# Patient Record
Sex: Female | Born: 1965 | Race: White | Hispanic: No | Marital: Married | Smoking: Former smoker
Health system: Southern US, Community
[De-identification: ages and names within clinical notes are randomized; demographics above are authoritative.]

## PROBLEM LIST (undated history)

## (undated) DIAGNOSIS — F329 Major depressive disorder, single episode, unspecified: Secondary | ICD-10-CM

## (undated) DIAGNOSIS — E039 Hypothyroidism, unspecified: Secondary | ICD-10-CM

## (undated) DIAGNOSIS — F32A Depression, unspecified: Secondary | ICD-10-CM

## (undated) DIAGNOSIS — F1010 Alcohol abuse, uncomplicated: Secondary | ICD-10-CM

## (undated) DIAGNOSIS — IMO0002 Reserved for concepts with insufficient information to code with codable children: Secondary | ICD-10-CM

## (undated) DIAGNOSIS — N159 Renal tubulo-interstitial disease, unspecified: Secondary | ICD-10-CM

## (undated) DIAGNOSIS — T4145XA Adverse effect of unspecified anesthetic, initial encounter: Secondary | ICD-10-CM

## (undated) DIAGNOSIS — K219 Gastro-esophageal reflux disease without esophagitis: Secondary | ICD-10-CM

## (undated) DIAGNOSIS — M810 Age-related osteoporosis without current pathological fracture: Secondary | ICD-10-CM

## (undated) DIAGNOSIS — M545 Low back pain: Secondary | ICD-10-CM

## (undated) DIAGNOSIS — R011 Cardiac murmur, unspecified: Secondary | ICD-10-CM

## (undated) DIAGNOSIS — F941 Reactive attachment disorder of childhood: Secondary | ICD-10-CM

## (undated) DIAGNOSIS — F419 Anxiety disorder, unspecified: Secondary | ICD-10-CM

## (undated) DIAGNOSIS — M1990 Unspecified osteoarthritis, unspecified site: Secondary | ICD-10-CM

## (undated) HISTORY — PX: AUGMENTATION MAMMAPLASTY: SUR837

## (undated) HISTORY — DX: Low back pain: M54.5

## (undated) HISTORY — DX: Hypothyroidism, unspecified: E03.9

## (undated) HISTORY — DX: Reserved for concepts with insufficient information to code with codable children: IMO0002

---

## 1995-03-17 DIAGNOSIS — T8859XA Other complications of anesthesia, initial encounter: Secondary | ICD-10-CM

## 1995-03-17 DIAGNOSIS — N159 Renal tubulo-interstitial disease, unspecified: Secondary | ICD-10-CM

## 1995-03-17 HISTORY — DX: Other complications of anesthesia, initial encounter: T88.59XA

## 1995-03-17 HISTORY — DX: Renal tubulo-interstitial disease, unspecified: N15.9

## 1997-05-04 ENCOUNTER — Observation Stay (HOSPITAL_COMMUNITY): Admission: RE | Admit: 1997-05-04 | Discharge: 1997-05-05 | Payer: Self-pay | Admitting: Obstetrics and Gynecology

## 1997-06-28 ENCOUNTER — Other Ambulatory Visit: Admission: RE | Admit: 1997-06-28 | Discharge: 1997-06-28 | Payer: Self-pay | Admitting: Obstetrics and Gynecology

## 1997-08-14 ENCOUNTER — Other Ambulatory Visit: Admission: RE | Admit: 1997-08-14 | Discharge: 1997-08-14 | Payer: Self-pay | Admitting: Obstetrics and Gynecology

## 1997-10-26 ENCOUNTER — Emergency Department (HOSPITAL_COMMUNITY): Admission: EM | Admit: 1997-10-26 | Discharge: 1997-10-26 | Payer: Self-pay | Admitting: Emergency Medicine

## 1997-11-08 ENCOUNTER — Ambulatory Visit (HOSPITAL_COMMUNITY): Admission: RE | Admit: 1997-11-08 | Discharge: 1997-11-08 | Payer: Self-pay | Admitting: Obstetrics and Gynecology

## 1998-01-28 ENCOUNTER — Other Ambulatory Visit: Admission: RE | Admit: 1998-01-28 | Discharge: 1998-01-28 | Payer: Self-pay | Admitting: Obstetrics and Gynecology

## 1998-07-02 ENCOUNTER — Inpatient Hospital Stay (HOSPITAL_COMMUNITY): Admission: AD | Admit: 1998-07-02 | Discharge: 1998-07-27 | Payer: Self-pay | Admitting: Obstetrics and Gynecology

## 1998-07-09 ENCOUNTER — Encounter: Payer: Self-pay | Admitting: Obstetrics and Gynecology

## 1998-07-15 ENCOUNTER — Encounter: Payer: Self-pay | Admitting: Obstetrics and Gynecology

## 1998-07-18 ENCOUNTER — Encounter: Payer: Self-pay | Admitting: Obstetrics and Gynecology

## 1998-07-22 ENCOUNTER — Encounter: Payer: Self-pay | Admitting: Obstetrics and Gynecology

## 1998-08-16 ENCOUNTER — Inpatient Hospital Stay (HOSPITAL_COMMUNITY): Admission: AD | Admit: 1998-08-16 | Discharge: 1998-08-20 | Payer: Self-pay | Admitting: Obstetrics and Gynecology

## 1998-09-25 ENCOUNTER — Other Ambulatory Visit: Admission: RE | Admit: 1998-09-25 | Discharge: 1998-09-25 | Payer: Self-pay | Admitting: Obstetrics and Gynecology

## 1998-11-05 ENCOUNTER — Ambulatory Visit (HOSPITAL_COMMUNITY): Admission: RE | Admit: 1998-11-05 | Discharge: 1998-11-05 | Payer: Self-pay | Admitting: Orthopaedic Surgery

## 1998-11-19 ENCOUNTER — Ambulatory Visit (HOSPITAL_COMMUNITY): Admission: RE | Admit: 1998-11-19 | Discharge: 1998-11-19 | Payer: Self-pay | Admitting: Orthopaedic Surgery

## 1998-12-02 ENCOUNTER — Ambulatory Visit (HOSPITAL_COMMUNITY): Admission: RE | Admit: 1998-12-02 | Discharge: 1998-12-02 | Payer: Self-pay | Admitting: Orthopaedic Surgery

## 2000-04-14 ENCOUNTER — Encounter: Payer: Self-pay | Admitting: Internal Medicine

## 2000-04-14 ENCOUNTER — Encounter: Admission: RE | Admit: 2000-04-14 | Discharge: 2000-04-14 | Payer: Self-pay | Admitting: Internal Medicine

## 2000-12-21 ENCOUNTER — Other Ambulatory Visit: Admission: RE | Admit: 2000-12-21 | Discharge: 2000-12-21 | Payer: Self-pay | Admitting: Obstetrics and Gynecology

## 2002-02-13 ENCOUNTER — Other Ambulatory Visit: Admission: RE | Admit: 2002-02-13 | Discharge: 2002-02-13 | Payer: Self-pay | Admitting: Obstetrics and Gynecology

## 2003-06-17 ENCOUNTER — Emergency Department (HOSPITAL_COMMUNITY): Admission: EM | Admit: 2003-06-17 | Discharge: 2003-06-17 | Payer: Self-pay | Admitting: Emergency Medicine

## 2003-07-16 ENCOUNTER — Ambulatory Visit (HOSPITAL_COMMUNITY): Admission: RE | Admit: 2003-07-16 | Discharge: 2003-07-16 | Payer: Self-pay | Admitting: Obstetrics and Gynecology

## 2005-03-16 HISTORY — PX: TUBAL LIGATION: SHX77

## 2005-07-15 ENCOUNTER — Encounter: Payer: Self-pay | Admitting: Obstetrics and Gynecology

## 2005-08-02 ENCOUNTER — Inpatient Hospital Stay (HOSPITAL_COMMUNITY): Admission: AD | Admit: 2005-08-02 | Discharge: 2005-08-02 | Payer: Self-pay | Admitting: Obstetrics and Gynecology

## 2005-08-27 ENCOUNTER — Inpatient Hospital Stay (HOSPITAL_COMMUNITY): Admission: AD | Admit: 2005-08-27 | Discharge: 2005-08-27 | Payer: Self-pay | Admitting: *Deleted

## 2005-09-02 ENCOUNTER — Inpatient Hospital Stay (HOSPITAL_COMMUNITY): Admission: AD | Admit: 2005-09-02 | Discharge: 2005-09-05 | Payer: Self-pay | Admitting: Obstetrics and Gynecology

## 2005-09-02 ENCOUNTER — Encounter (INDEPENDENT_AMBULATORY_CARE_PROVIDER_SITE_OTHER): Payer: Self-pay | Admitting: *Deleted

## 2006-06-16 ENCOUNTER — Ambulatory Visit: Payer: Self-pay | Admitting: Endocrinology

## 2006-07-23 ENCOUNTER — Ambulatory Visit: Payer: Self-pay | Admitting: Internal Medicine

## 2006-08-04 ENCOUNTER — Encounter: Admission: RE | Admit: 2006-08-04 | Discharge: 2006-08-04 | Payer: Self-pay | Admitting: Obstetrics and Gynecology

## 2006-10-20 ENCOUNTER — Ambulatory Visit: Payer: Self-pay | Admitting: Endocrinology

## 2006-12-01 ENCOUNTER — Encounter: Admission: RE | Admit: 2006-12-01 | Discharge: 2006-12-01 | Payer: Self-pay | Admitting: Obstetrics and Gynecology

## 2006-12-13 ENCOUNTER — Encounter: Payer: Self-pay | Admitting: *Deleted

## 2006-12-13 DIAGNOSIS — M545 Low back pain, unspecified: Secondary | ICD-10-CM

## 2006-12-13 HISTORY — DX: Low back pain, unspecified: M54.50

## 2006-12-16 ENCOUNTER — Ambulatory Visit: Payer: Self-pay | Admitting: Endocrinology

## 2006-12-16 ENCOUNTER — Ambulatory Visit: Payer: Self-pay | Admitting: Cardiology

## 2006-12-16 LAB — CONVERTED CEMR LAB
Basophils Absolute: 0 10*3/uL (ref 0.0–0.1)
Eosinophils Absolute: 0.1 10*3/uL (ref 0.0–0.6)
Eosinophils Relative: 1.1 % (ref 0.0–5.0)
Lymphocytes Relative: 45.3 % (ref 12.0–46.0)
MCHC: 33.6 g/dL (ref 30.0–36.0)
MCV: 83.7 fL (ref 78.0–100.0)
Neutro Abs: 3.2 10*3/uL (ref 1.4–7.7)
Platelets: 240 10*3/uL (ref 150–400)
RBC: 4.63 M/uL (ref 3.87–5.11)

## 2007-03-28 ENCOUNTER — Telehealth (INDEPENDENT_AMBULATORY_CARE_PROVIDER_SITE_OTHER): Payer: Self-pay | Admitting: *Deleted

## 2007-04-20 ENCOUNTER — Telehealth: Payer: Self-pay | Admitting: Endocrinology

## 2008-02-02 ENCOUNTER — Ambulatory Visit (HOSPITAL_COMMUNITY): Admission: RE | Admit: 2008-02-02 | Discharge: 2008-02-02 | Payer: Self-pay | Admitting: Obstetrics and Gynecology

## 2008-04-09 ENCOUNTER — Ambulatory Visit: Payer: Self-pay | Admitting: Endocrinology

## 2008-04-09 DIAGNOSIS — E039 Hypothyroidism, unspecified: Secondary | ICD-10-CM

## 2008-04-09 HISTORY — DX: Hypothyroidism, unspecified: E03.9

## 2008-04-09 LAB — CONVERTED CEMR LAB: TSH: 8.75 microintl units/mL — ABNORMAL HIGH (ref 0.35–5.50)

## 2008-04-17 ENCOUNTER — Ambulatory Visit: Payer: Self-pay | Admitting: Internal Medicine

## 2008-04-17 ENCOUNTER — Encounter: Payer: Self-pay | Admitting: Endocrinology

## 2008-05-23 ENCOUNTER — Ambulatory Visit: Payer: Self-pay | Admitting: Endocrinology

## 2008-05-25 LAB — CONVERTED CEMR LAB: TSH: 0.56 microintl units/mL (ref 0.35–5.50)

## 2008-08-22 ENCOUNTER — Ambulatory Visit: Payer: Self-pay | Admitting: Vascular Surgery

## 2008-09-19 ENCOUNTER — Ambulatory Visit: Payer: Self-pay | Admitting: Vascular Surgery

## 2009-02-15 ENCOUNTER — Ambulatory Visit (HOSPITAL_COMMUNITY): Admission: RE | Admit: 2009-02-15 | Discharge: 2009-02-15 | Payer: Self-pay | Admitting: Obstetrics and Gynecology

## 2009-02-20 ENCOUNTER — Encounter: Admission: RE | Admit: 2009-02-20 | Discharge: 2009-02-20 | Payer: Self-pay | Admitting: Obstetrics and Gynecology

## 2009-06-13 ENCOUNTER — Ambulatory Visit: Payer: Self-pay | Admitting: Endocrinology

## 2009-06-13 DIAGNOSIS — R599 Enlarged lymph nodes, unspecified: Secondary | ICD-10-CM | POA: Insufficient documentation

## 2009-06-13 LAB — CONVERTED CEMR LAB
ALT: 26 units/L (ref 0–35)
AST: 25 units/L (ref 0–37)
Albumin: 4.3 g/dL (ref 3.5–5.2)
Alkaline Phosphatase: 41 units/L (ref 39–117)
Basophils Relative: 0.7 % (ref 0.0–3.0)
Calcium: 9.2 mg/dL (ref 8.4–10.5)
Direct LDL: 96.9 mg/dL
Eosinophils Relative: 0.4 % (ref 0.0–5.0)
GFR calc non Af Amer: 83.3 mL/min (ref >60)
Glucose, Bld: 114 mg/dL — ABNORMAL HIGH (ref 70–99)
HCT: 38.4 % (ref 36.0–46.0)
HDL: 103.5 mg/dL (ref >39.00)
Hemoglobin: 12.7 g/dL (ref 12.0–15.0)
Lymphocytes Relative: 28.6 % (ref 12.0–46.0)
Lymphs Abs: 2 10*3/uL (ref 0.7–4.0)
Monocytes Relative: 8.7 % (ref 3.0–12.0)
Neutro Abs: 4.5 10*3/uL (ref 1.4–7.7)
Potassium: 4.3 meq/L (ref 3.5–5.1)
RBC: 4.34 M/uL (ref 3.87–5.11)
Sodium: 138 meq/L (ref 135–145)
Total CHOL/HDL Ratio: 2
Total Protein: 7.8 g/dL (ref 6.0–8.3)
Triglycerides: 45 mg/dL (ref 0.0–149.0)

## 2009-07-10 ENCOUNTER — Emergency Department (HOSPITAL_BASED_OUTPATIENT_CLINIC_OR_DEPARTMENT_OTHER): Admission: EM | Admit: 2009-07-10 | Discharge: 2009-07-10 | Payer: Self-pay | Admitting: Emergency Medicine

## 2009-09-19 ENCOUNTER — Telehealth: Payer: Self-pay | Admitting: Endocrinology

## 2009-09-19 ENCOUNTER — Encounter: Payer: Self-pay | Admitting: Endocrinology

## 2009-09-20 ENCOUNTER — Encounter: Payer: Self-pay | Admitting: Endocrinology

## 2009-09-20 ENCOUNTER — Telehealth: Payer: Self-pay | Admitting: Endocrinology

## 2009-09-22 ENCOUNTER — Encounter: Payer: Self-pay | Admitting: Endocrinology

## 2009-09-22 DIAGNOSIS — L708 Other acne: Secondary | ICD-10-CM

## 2010-02-11 ENCOUNTER — Telehealth: Payer: Self-pay | Admitting: Endocrinology

## 2010-04-05 ENCOUNTER — Other Ambulatory Visit: Payer: Self-pay | Admitting: Obstetrics and Gynecology

## 2010-04-05 DIAGNOSIS — Z1231 Encounter for screening mammogram for malignant neoplasm of breast: Secondary | ICD-10-CM

## 2010-04-17 NOTE — Progress Notes (Signed)
Summary: Rx req  Phone Note Call from Patient Call back at [facility] Phone [phone] Call back at Work Phone [phone]   Caller: Patient Summary of Call: Pt called requesting MD call in Rx for generic Retin-A. Pt states that she has been Rxd this by SAE before for adult acne. Pt was advised that this is not on med list but she still requests that I speak with MD. Please advise. Initial call taken by: Margaret Pyle, CMA,  September 19, 2009 2:27 PM  Follow-up for Phone Call        sent Follow-up by: Minus Breeding MD,  September 19, 2009 2:33 PM  Additional Follow-up for Phone Call Additional follow up Details #1::        pt is also requesting Minocycline (per pt she sometimes also gets cyst with acne) Additional Follow-up by: Margaret Pyle, CMA,  September 19, 2009 2:43 PM    Additional Follow-up for Phone Call Additional follow up Details #2::    this is on med list.  i refilled as needed. Follow-up by: Minus Breeding MD,  September 19, 2009 3:55 PM  Additional Follow-up for Phone Call Additional follow up Details #3:: Details for Additional Follow-up Action Taken: Pt informed Additional Follow-up by: Margaret Pyle, CMA,  September 19, 2009 4:07 PM  New/Updated Medications: RETIN-A 0.025 % CREA (TRETINOIN) apply at bedtime Prescriptions: DOXYCYCLINE HYCLATE 100 MG TABS (DOXYCYCLINE HYCLATE) 1 tab two times a day  #20 x 5   Entered and Authorized by:   Minus Breeding MD   Signed by:   Minus Breeding MD on 09/19/2009   Method used:   Electronically to        [facility] (retail)       [address]       [place], [place]  [address]       Ph: [phone] or [phone]       Fax: [phone]   RxID:   [id number] RETIN-A 0.025 % CREA (TRETINOIN) apply at bedtime  #1 med tube x 11   Entered by:   Margaret Pyle, CMA   Authorized by:   Minus Breeding MD   Signed by:   Margaret Pyle, CMA on 09/19/2009   Method  used:   Electronically to        [facility] (retail)       [address]       [place], [place]  [address]       Ph: [phone] or [phone]       Fax: [phone]   RxID:   [id number] RETIN-A 0.025 % CREA (TRETINOIN) apply at bedtime  #1 med tube x 11   Entered and Authorized by:   Minus Breeding MD   Signed by:   Minus Breeding MD on 09/19/2009   Method used:   Electronically to        [facility] [phone]* (retail)       [address]       [place], [place]  [address]       Ph: [phone]       Fax: [phone]   RxID:   [id number]

## 2010-04-17 NOTE — Progress Notes (Signed)
Summary: Results-Health Screenings  Phone Note Call from Patient Call back at Home Phone [phone] Call back at Work Phone [phone]   Caller: Patient Summary of Call: Regarding recent results of health screening sent for MD to review, pt wants to know if there is anything she needs to do now regarding her results. Pt has scheduled appointment for CPX for March 2012. Initial call taken by: Brenton Grills CMA Duncan Dull),  February 11, 2010 4:04 PM  Follow-up for Phone Call        no need.  we can address then.   Follow-up by: Minus Breeding MD,  February 11, 2010 4:09 PM  Additional Follow-up for Phone Call Additional follow up Details #1::        pt informed Additional Follow-up by: Brenton Grills CMA Duncan Dull),  February 11, 2010 4:44 PM

## 2010-04-17 NOTE — Assessment & Plan Note (Signed)
Summary: FU--D/T---STC   Vital Signs:  Patient profile:   45 year old female Height:      71 inches (180.34 cm) Weight:      132.13 pounds (60.06 kg) BMI:     18.50 O2 Sat:      96 % on Room air Temp:     97.2 degrees F (36.22 degrees C) oral Pulse rate:   75 / minute BP sitting:   132 / 90  (left arm) Cuff size:   regular  Vitals Entered By: Josph Macho RMA (June 13, 2009 10:31 AM)  O2 Flow:  Room air CC: Follow-up visit/ pt has a lump on the right side of neck/ CF Is Patient Diabetic? No   CC:  Follow-up visit/ pt has a lump on the right side of neck/ CF.  History of Present Illness: pt states few days of painful nodule at the right lateral neck.  there is slight associated pain.  she has had them on the face before. pt has pain at the right elbow, which she feels is related to using a computer mouse.  Current Medications (verified): 1)  Prozac 20 Mg  Caps (Fluoxetine Hcl) .... Take 1 By Mouth Qd 2)  Alprazolam 1 Mg  Tabs (Alprazolam) .... Take 1 By Mouth Once Daily Prn 3)  Vicodin 5-500 Mg  Tabs (Hydrocodone-Acetaminophen) .... Take 1-2 By Mouth Once Daily Prn 4)  Adderall 20 Mg Tabs (Amphetamine-Dextroamphetamine) .... Take 1 By Mouth Once Daily (Mon-Fri) 5)  Synthroid 150 Mcg Tabs (Levothyroxine Sodium) .... 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 divorced works [employer]  Review of Systems  The patient denies fever.         she states weight loss due to anxiety  Physical Exam  General:  normal appearance.   Head:  head: no deformity eyes: no periorbital swelling, no proptosis external nose and ears are normal mouth: no lesion seen Neck:  there is a tender 1 cm skin nodule at the right neck. Extremities:  right  elbow: normal to my exam Additional Exam:  Hemoglobin A1C       [H]  6.6 %    Impression & Recommendations:  Problem # 1:  skin abscess very small  Problem # 2:  elbow pain uncertain etiology  Problem # 3:  HYPOTHYROIDISM (ICD-244.9) well-replaced.  Medications Added to Medication List This Visit: 1)  Doxycycline Hyclate 100 Mg Tabs (Doxycycline hyclate) .Marland Kitchen.. 1 tab two times a day  Other Orders: TLB-TSH (Thyroid Stimulating Hormone) (84443-TSH) TLB-Lipid Panel (80061-LIPID) TLB-BMP (Basic Metabolic Panel-BMET) (80048-METABOL) TLB-CBC Platelet - w/Differential (85025-CBCD) TLB-Hepatic/Liver Function Pnl (80076-HEPATIC) Est. Patient Level IV ([address])  Patient Instructions: 1)  doxycycline 100 mg two times a day 2)  blood tests today. 3)  tests are being ordered for you today.  a few days after the test(s), please call [phone] to hear your test results. 4)  please let me know if you want to see a specialist for your right elbow. 5)  (update: i left message on phone-tree:  rx as we discussed) Prescriptions: SYNTHROID 150 MCG TABS (LEVOTHYROXINE SODIUM) qd  #90 Tablet x 3   Entered and Authorized by:   Minus Breeding MD   Signed by:   Minus Breeding MD on 06/16/2009   Method used:  Electronically to        [facility] (retail)       [address]       [place], [place]  [address]       Ph: [phone] or [phone]       Fax: [phone]   RxID:   [id number] DOXYCYCLINE HYCLATE 100 MG TABS (DOXYCYCLINE HYCLATE) 1 tab two times a day  #20 x 0   Entered and Authorized by:   Minus Breeding MD   Signed by:   Minus Breeding MD on 06/13/2009   Method used:   Electronically to        [facility] [phone]* (retail)       [address]       [place], [place]  [address]       Ph: [phone]       Fax: [phone]   RxID:   [id number]

## 2010-04-17 NOTE — Medication Information (Signed)
Summary: Tretinoin/[employer]  Tretinoin/[employer]   Imported By: Sherian Rein 09/26/2009 08:48:41  _____________________________________________________________________  External Attachment:    Type:   Image     Comment:   External Document

## 2010-04-17 NOTE — Miscellaneous (Signed)
  Clinical Lists Changes  Problems: Added new problem of OTHER ACNE (ICD-706.1)

## 2010-04-17 NOTE — Medication Information (Signed)
Summary: Tretinoin / [employer] [place]  Tretinoin / [employer] [place]   Imported By: Lennie Odor 09/24/2009 12:17:43  _____________________________________________________________________  External Attachment:    Type:   Image     Comment:   External Document

## 2010-04-17 NOTE — Progress Notes (Signed)
Summary: PA RETIN A   Phone Note From Pharmacy   Summary of Call: Pharm called, pt needs PA on Retin A cream. Please help, pt is leaving town today and would like to fill med. [phone] Initial call taken by: Lamar Sprinkles, CMA,  September 20, 2009 10:19 AM  Follow-up for Phone Call        I began this yesterday, Awaiting MD. Due to process,  may not be approved today. Follow-up by: Lucious Groves,  September 20, 2009 10:33 AM  Additional Follow-up for Phone Call Additional follow up Details #1::        Left detailed vm for pt on hm # Additional Follow-up by: Lamar Sprinkles, CMA,  September 20, 2009 11:26 AM     Appended Document: PA RETIN A  Form completed and faxed, will await insurance company reply.  Appended Document: PA RETIN A  Approved 07.07.2011-07.06.2013

## 2010-05-01 ENCOUNTER — Telehealth: Payer: Self-pay | Admitting: Endocrinology

## 2010-05-07 NOTE — Progress Notes (Signed)
Summary: Call Report  Phone Note Other Incoming   Caller: [facility] Summary of Call: [facility] Triage Call Report Triage Record Num: [id number] Operator: Freddie Breech Patient Name: Alyssa Garner Call Date & Time: 04/28/2010 7:31:06AM Patient Phone: [phone] PCP: Romero Belling Patient Gender: Female PCP Fax : [phone] Patient DOB: 07-11-1965 Practice Name: [facility] Reason for Call: C/o taking 2 Levothyroxine 150 mcg this AM. HA, mild dizziness. She thinks she took 2 Levothroxine on 04/27/10 also. Tomasa Rand at [employer]. Protocol(s) Used: Poisoning Recommended Outcome per Protocol: Call [facility] Immediately Reason for Outcome: Accidently took more than the prescribed or recommended dosage OR more frequently than the prescribed or recommended dosage Care Advice:  ~ 02/ Initial call taken by: Margaret Pyle, CMA,  May 01, 2010 10:35 AM

## 2010-06-09 ENCOUNTER — Telehealth: Payer: Self-pay

## 2010-06-09 ENCOUNTER — Ambulatory Visit: Payer: Self-pay

## 2010-06-09 DIAGNOSIS — E039 Hypothyroidism, unspecified: Secondary | ICD-10-CM

## 2010-06-09 NOTE — Telephone Encounter (Signed)
Pt called stating she has been experiencing extreme fatigue and weight gain lately. Pt is requesting to have TSH checked today where she works and then go over results at [facility] next week with SAE. Pt is requesting lab order be faxed to [phone]

## 2010-06-09 NOTE — Telephone Encounter (Signed)
i ordered

## 2010-06-10 ENCOUNTER — Other Ambulatory Visit (INDEPENDENT_AMBULATORY_CARE_PROVIDER_SITE_OTHER): Payer: BC Managed Care – PPO

## 2010-06-10 ENCOUNTER — Encounter: Payer: Self-pay | Admitting: Endocrinology

## 2010-06-10 ENCOUNTER — Ambulatory Visit (INDEPENDENT_AMBULATORY_CARE_PROVIDER_SITE_OTHER): Payer: BC Managed Care – PPO | Admitting: Endocrinology

## 2010-06-10 DIAGNOSIS — R209 Unspecified disturbances of skin sensation: Secondary | ICD-10-CM | POA: Insufficient documentation

## 2010-06-10 DIAGNOSIS — E039 Hypothyroidism, unspecified: Secondary | ICD-10-CM

## 2010-06-10 DIAGNOSIS — Z0000 Encounter for general adult medical examination without abnormal findings: Secondary | ICD-10-CM

## 2010-06-10 DIAGNOSIS — M545 Low back pain: Secondary | ICD-10-CM

## 2010-06-10 LAB — HEPATIC FUNCTION PANEL
AST: 23 U/L (ref 0–37)
Albumin: 3.8 g/dL (ref 3.5–5.2)

## 2010-06-10 LAB — URINALYSIS, ROUTINE W REFLEX MICROSCOPIC
Ketones, ur: NEGATIVE
Leukocytes, UA: NEGATIVE
Nitrite: NEGATIVE
Specific Gravity, Urine: 1.01 (ref 1.000–1.030)
Total Protein, Urine: NEGATIVE
pH: 7.5 (ref 5.0–8.0)

## 2010-06-10 LAB — BASIC METABOLIC PANEL
BUN: 14 mg/dL (ref 6–23)
Calcium: 9 mg/dL (ref 8.4–10.5)
Creatinine, Ser: 0.9 mg/dL (ref 0.4–1.2)
GFR: 71.46 mL/min (ref >60.00)
Glucose, Bld: 86 mg/dL (ref 70–99)
Potassium: 4.7 mEq/L (ref 3.5–5.1)

## 2010-06-10 LAB — CBC WITH DIFFERENTIAL/PLATELET
Basophils Relative: 0.5 % (ref 0.0–3.0)
Eosinophils Relative: 1.1 % (ref 0.0–5.0)
Lymphocytes Relative: 21 % (ref 12.0–46.0)
Neutrophils Relative %: 69.1 % (ref 43.0–77.0)
RBC: 4.06 Mil/uL (ref 3.87–5.11)
WBC: 8.7 10*3/uL (ref 4.5–10.5)

## 2010-06-10 LAB — SEDIMENTATION RATE: Sed Rate: 10 mm/hr (ref 0–22)

## 2010-06-10 LAB — LIPID PANEL
Total CHOL/HDL Ratio: 2
Triglycerides: 90 mg/dL (ref 0.0–149.0)

## 2010-06-10 NOTE — Telephone Encounter (Signed)
Pt requested to have TSH checked at her employer. Can test be re-ordered and fax to requested facility?

## 2010-06-10 NOTE — Telephone Encounter (Signed)
i printed letter 

## 2010-06-10 NOTE — Patient Instructions (Addendum)
blood tests are being ordered for you today.  please call [phone] to hear your test results. Please return here as scheduled. (update: i left message on phone-tree:  Labs are normal)

## 2010-06-10 NOTE — Progress Notes (Signed)
  Subjective:    Patient ID: Alyssa Garner, female    DOB: Aug 05, 1965, 44 y.o.   MRN: [id number]  HPI Pt states few weeks of moderate numbness of the fingers, and assoc fatigue, constipation, weight gain, and abdominal bloating.  Her chronic back pain has also flared up, and involves her spine, from the neck to the lumbar area.  Her ex-husband has recently moved away. Past Medical History  Diagnosis Date  . Long-term current use of steroids   . HYPOTHYROIDISM 04/09/2008  . LOW BACK PAIN 12/13/2006  . LYMPHADENOPATHY 06/13/2009   Past Surgical History  Procedure Date  . Tubal ligation     reports that she has never smoked. She does not have any smokeless tobacco history on file. She reports that she drinks alcohol. She reports that she does not use illicit drugs. family history is not on file.  She is adopted. No Known Allergies   Review of Systems Denies diarrhea.  Menses are regular, but are slightly longer in duration recently.      Objective:   Physical Exam GENERAL: no distress HEAD: head: no deformity eyes: no periorbital swelling, no proptosis external nose and ears are normal mouth: no lesion seen Neck:  Thyroid is normal. Msk:  Spine is nontender ABDOMEN:abdomen is soft  There is slight left sided tenderness. no hepatosplenomegaly.   not distended.  no hernia. Legs: no edema Neuro: sensation is intact to touch on the fingers, but slightly decreased from normal.       Lab Results  Component Value Date   WBC 8.7 06/10/2010   HGB 12.9 06/10/2010   HCT 37.9 06/10/2010   PLT 250.0 06/10/2010   CHOL 168 06/10/2010   TRIG 90.0 06/10/2010   HDL 77.30 06/10/2010   LDLDIRECT 96.9 06/13/2009   ALT 16 06/10/2010   AST 23 06/10/2010   NA 142 06/10/2010   K 4.7 06/10/2010   CL 105 06/10/2010   CREATININE 0.9 06/10/2010   BUN 14 06/10/2010   CO2 33* 06/10/2010   TSH 3.71 06/10/2010      Assessment & Plan:  Low-back pain, exacerbation.  She sees pain specialist. Numbness,  uncertain etiology Hypothyroidism.  Well-replaced

## 2010-06-10 NOTE — Telephone Encounter (Signed)
Faxed as requested

## 2010-06-11 LAB — VITAMIN B12: Vitamin B-12: 429 pg/mL (ref 211–911)

## 2010-06-12 ENCOUNTER — Encounter: Payer: Self-pay | Admitting: Endocrinology

## 2010-06-19 ENCOUNTER — Ambulatory Visit: Payer: Self-pay

## 2010-06-19 ENCOUNTER — Ambulatory Visit (INDEPENDENT_AMBULATORY_CARE_PROVIDER_SITE_OTHER): Payer: BC Managed Care – PPO | Admitting: Endocrinology

## 2010-06-19 ENCOUNTER — Encounter: Payer: Self-pay | Admitting: Endocrinology

## 2010-06-19 VITALS — BP 122/76 | HR 100 | Temp 97.6°F | Ht 70.0 in | Wt 134.2 lb

## 2010-06-19 DIAGNOSIS — Z0000 Encounter for general adult medical examination without abnormal findings: Secondary | ICD-10-CM

## 2010-06-19 MED ORDER — LEVOTHYROXINE SODIUM 150 MCG PO TABS: 150.0000 ug | ORAL_TABLET | Freq: Every day | ORAL | Status: DC

## 2010-06-19 MED ORDER — TRETINOIN 0.025 % EX CREA: TOPICAL_CREAM | Freq: Every day | CUTANEOUS | Status: DC

## 2010-06-19 NOTE — Patient Instructions (Addendum)
please consider these measures for your health:  minimize alcohol.  do not use tobacco products.  have a colonoscopy at least every 10 years from age 45.  keep firearms safely stored.  always use seat belts.  have working smoke alarms in your home.  see an eye doctor and dentist regularly.  never drive under the influence of alcohol or drugs (including prescription drugs).  those with fair skin should take precautions against the sun. Please return in 1 year.   

## 2010-06-19 NOTE — Progress Notes (Signed)
  Subjective:    Patient ID: Alyssa Garner, female    DOB: 1965-09-13, 45 y.o.   MRN: [id number]  HPI Pt says he gi sxs are resolved, but she still has numbness of the hands.  She seldom has assoc pain, but mostly she has cold sensitivity of the hands.  She says she is utd on mammography and tetanus vaccine.  Low-back pain is the same.   Past Medical History  Diagnosis Date  . Long-term current use of steroids   . HYPOTHYROIDISM 04/09/2008  . LOW BACK PAIN 12/13/2006  . LYMPHADENOPATHY 06/13/2009   Past Surgical History  Procedure Date  . Tubal ligation     reports that she has never smoked. She does not have any smokeless tobacco history on file. She reports that she drinks alcohol. She reports that she does not use illicit drugs. Gyn is dr [place] Psych is dr Gaynell Face Pain is dr Vear Clock Divorced Works for [employer], infrequent overseas travel. family history is not on file.  She is adopted. No Known Allergies  Review of Systems  Constitutional: Negative for fever.  HENT: Negative for hearing loss.   Eyes: Negative for visual disturbance.  Respiratory:       [She has a sensation of "lungs not filling up" Cardiovascular: Negative for chest pain.  Gastrointestinal: Negative for anal bleeding.  Genitourinary: Negative for hematuria.  Musculoskeletal: Negative for joint swelling and arthralgias.  Skin: Negative for rash.  Neurological: Negative for seizures, syncope and headaches.  Hematological: Does not bruise/bleed easily.  Psychiatric/Behavioral:       [Sleep is better with medication      Objective:   Physical Exam VS: see vs page GEN: no distress HEAD: head: no deformity eyes: no periorbital swelling, no proptosis external nose and ears are normal mouth: no lesion seen NECK: supple, thyroid is not enlarged CHEST WALL: no deformity BREASTS:  sees gyn CV: reg rate and rhythm, no murmur ABD: abdomen is soft, nontender.  no hepatosplenomegaly.  not distended.   no hernia GENITALIA: sees gyn MUSCULOSKELETAL: muscle bulk and strength are grossly normal.  no obvious joint swelling.  gait is normal and steady EXTEMITIES: no deformity.  no ulcer on the feet.  feet are of normal color and temp.  no edema.  There are a few bilat varicosities (she had vein surgery 4 years ago). PULSES: dorsalis pedis intact bilat.  no carotid bruit NEURO:  cn 2-12 grossly intact.   readily moves all 4's.  sensation is intact to touch on the feet SKIN:  Normal texture and temperature.  No rash or suspicious lesion is visible.   NODES:  None palpable at the neck PSYCH: alert, oriented x3.  She has a mildly depressed affect.         Assessment & Plan:  Wellness visit today.  Chronic probs re stable, except as noted

## 2010-06-24 ENCOUNTER — Telehealth: Payer: Self-pay

## 2010-06-24 ENCOUNTER — Telehealth: Payer: Self-pay | Admitting: Gastroenterology

## 2010-06-24 DIAGNOSIS — K5900 Constipation, unspecified: Secondary | ICD-10-CM

## 2010-06-24 DIAGNOSIS — IMO0001 Reserved for inherently not codable concepts without codable children: Secondary | ICD-10-CM

## 2010-06-24 NOTE — Telephone Encounter (Signed)
Pt called requesting referral to GI for abdominal bloating, weight gain and constipation on and off x 3 weeks. Pt was seen by SAE for same 06/10/2010

## 2010-06-24 NOTE — Telephone Encounter (Signed)
Pt advised via VM, told to expect a call from [facility] with appt info

## 2010-06-24 NOTE — Telephone Encounter (Signed)
Ok - referral ordered as requested 

## 2010-06-25 NOTE — Telephone Encounter (Signed)
Pt appt was rescheduled to 06/30/10  She is aware.  She did advise me that she saw another GI doctor in 2006 but does not remember who it was or where.

## 2010-06-30 ENCOUNTER — Ambulatory Visit: Payer: BC Managed Care – PPO | Admitting: Gastroenterology

## 2010-06-30 ENCOUNTER — Telehealth: Payer: Self-pay

## 2010-06-30 NOTE — Telephone Encounter (Signed)
Pt rescheduled appt and will not be charged

## 2010-07-29 ENCOUNTER — Ambulatory Visit: Payer: BC Managed Care – PPO | Admitting: Gastroenterology

## 2010-07-29 NOTE — Assessment & Plan Note (Signed)
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  She  received a total of 4 mL of 0.3% sodium tetradecyl mixed 50/50 with CO2.  She had no immediate complications and we will follow up with her by  telephone in several days.  I did explain to the patient that hopefully  we can improve her symptoms related to this.  I explained that I felt  comfortable we would be able to occlude the reticular varicosities and  the only way to know whether she would have improved symptoms would be  treatment.  I explained anticipated extremely low risk for complications  with this.  She did well initially and was instructed on post procedural  and advised of her outcome.   Larina Earthly, M.D.  Electronically Signed   TFE/MEDQ  D:  09/19/2008  T:  09/20/2008  Job:  [id number]

## 2010-07-29 NOTE — Letter (Signed)
September 05, 2008   Appeals Unit - Level 1  [employer] [place] [employer] [place] [address] [address]  [place], [place] [address]  Fax [phone]   Re:  [name], [name]             DOB:  [birth year]   To Whom It May Concern:   This letter is concerning denial of treatment for Alyssa Garner Reason.  She  has asked me to write this letter for appeal.  Ms. 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  These  have been present for several years and were causing increasing pain.  Ms. [name] job requires prolonged standing for presentations and events  and this is extremely difficult due to throbbing and severe pain in the  vulvar region to the reticular varicosities.  She has had to stop all  cardio exercise as a result of the throbbing and severe pain in the  vulvar region of these varicosities.  She also reports this is causing  increased difficulty in caring for her 38-year-old child and performing  household and cooking duties due to throbbing pain as well.  She also  reports that this interferes with sexual relations due to throbbing and  pain over these areas.  I feel that this is the appropriate treatment  with sclerotherapy as I discussed with Ms. Alyssa Garner in the past and would  request approval for sclerotherapy of these reticular veins for symptom  relief.  Thank you for your consideration.   Larina Earthly, M.D.  Electronically Signed   TFE/MEDQ  D:  09/05/2008  T:  09/06/2008  Job:  [id number]   cc:   Lenoard Aden, M.D.  Dr Early Chars

## 2010-07-29 NOTE — Consult Note (Signed)
[facility]                          ENDOCRINOLOGY CONSULTATION   ABIR, EROH                    MRN:          [id number]  DATE:10/20/2006                            DOB:          07-05-1965    REASON FOR VISIT:  Followup thyroid.   HISTORY OF PRESENT ILLNESS:  A 45 year old woman who is now 13 months  postpartum. She takes Synthroid 125 mcg a day. She states her menstrual  periods have a variable flow, but with decreased frequency.   PAST MEDICAL HISTORY:  She was breast feeding for 7 months but has not  been off for 6 months.   REVIEW OF SYSTEMS:  Denies any change in her weight recently.   PHYSICAL EXAMINATION:  VITAL SIGNS:  Blood pressure is 107/73, heart  rate 66, temperature is 98, weight is 140.  GENERAL:  No distress.  NECK:  The thyromegaly is only slightly enlarged with a slightly  irregular surface.   LABORATORY DATA:  TSH 0.72.   IMPRESSION:  Hypothyroidism. She is well replaced.   PLAN:  1. Continue Synthroid 125 mcg a day.  2. Return in a year.     Sean A. Everardo All, MD  Electronically Signed    SAE/MedQ  DD: 10/24/2006  DT: 10/25/2006  Job #: [id number]   cc:   Lenoard Aden, M.D.

## 2010-07-29 NOTE — Procedures (Signed)
LOWER EXTREMITY VENOUS REFLUX EXAM   INDICATION:  Bilateral leg varicose veins with pain.   EXAM:  Using color-flow imaging and pulse Doppler spectral analysis, the  right and left common femoral, superficial femoral, popliteal, posterior  tibial, greater and lesser saphenous veins are evaluated.  There is no  evidence suggesting deep venous insufficiency in the right and left  lower extremity.   The right and left saphenofemoral junction is competent.  The right and  left GSV is competent with the caliber as described below.   The right and left proximal short saphenous vein demonstrates  competency.   GSV Diameter (used if found to be incompetent only)                                            Right    Left  Proximal Greater Saphenous Vein           cm       cm  Proximal-to-mid-thigh                     cm       cm  Mid thigh                                 cm       cm  Mid-distal thigh                          cm       cm  Distal thigh                              cm       cm  Knee                                      cm       cm    IMPRESSION:  1. No evidence of reflux noted in the right and left greater saphenous      vein.  2. The right and left greater saphenous vein is not aneurysmal.  3. The right and left greater saphenous vein is not tortuous.  4. The deep venous system is competent.  5. The right and left lesser saphenous vein is competent.  6. No evidence of deep venous thrombosis noted in bilateral legs.       ___________________________________________  Larina Earthly, M.D.   MG/MEDQ  D:  08/22/2008  T:  08/22/2008  Job:  [id number]

## 2010-07-29 NOTE — Consult Note (Signed)
NEW PATIENT CONSULTATION   Alyssa Garner, Alyssa Garner  DOB:  1965-06-09                                       08/22/2008  VHQIO#:[id number]   Patient presents today for evaluation of venous pathology.  She is a  very pleasant 45 year old white female with a prior history of left leg  and left vulvar varices.  She underwent high ligation of her saphenous  vein and tributary phlebectomies by Dr. Marcy Panning in September, 2007  in her left leg.  She also had some removal of tributary with TriVex in  her right leg.  She did not have the vulvar varicosities addressed  specifically at that time other than ligation of her tributary branches  off the saphenofemoral junction.  She reports that she has continued to  have difficulty.  She has throbbing and pain in the vulvar region over  the reticular varicosities.  This is quite disabling to her.  She also  has some reticular veins in her ankles, more prominent on the left than  on the right.  She does not have any history of deep venous thrombosis  and no other major medical difficulties.   She is adopted; therefore, unknown family history of varices.   SOCIAL HISTORY:  She is married with 3 children.  She works in  administration.  She does not smoke, having quit over 15 years and does  not drink alcohol.   PHYSICAL EXAMINATION:  A well-developed and well-nourished white female  appearing her staged age of 16.  Her radial and dorsalis pedis pulses  are 2+ bilaterally.  She does have some small reticular varicosities  over her pretibial area above her ankle on the left but no evidence of  varicose veins.  She does have reticular varicosities over her left  vulvar area and her perineum.  These are tender to the touch.   She underwent noninvasive vascular laboratory studies in our office and  shows no evidence of reflux of her great saphenous vein or in her small  saphenous vein.  I discussed this in length with the patient.   I  explained that she apparently is having pain related to the incision  over the reticular varicosities in her vulvar area.  I explained the  only treatment option would be sclerotherapy of these veins.  I  explained that if we could successfully sclerose these, she should have  less discomfort since she would no longer have the distention that she  is currently suffering from.  I explained the procedure to include a  small needle into these veins and an injection of sodium tetradecyl to  sclerose the veins.  I explained that this may not completely occlude  all of the reticular veins but hopefully would be able to achieve some  improved symptoms.  I explained that it certainly is not dangerous to  leave this as it is, but she is not comfortable with this, and she is  having a great deal of pain associated with this.  I have explained that  there may be some difficulty in insurance approval for this due to the  unusual nature of this.  I did explain the potential out-of-pocket  experience for the sclerotherapy as well.  We will proceed once we have  heard from her insurance company.   Larina Earthly, M.D.  Electronically Signed  TFE/MEDQ  D:  08/22/2008  T:  08/22/2008  Job:  [id number]   cc:   Lenoard Aden, M.D.

## 2010-08-01 NOTE — Assessment & Plan Note (Signed)
[facility]                                 ON-CALL NOTE   Alyssa Garner, Alyssa Garner                      MRN:          [id number]  DATE:12/18/2006                            DOB:          February 27, 1966    [name] on December 18, 2006, at 8:06 a.m.  Date of birth is 07/01/1965.  Phone number is [phone].  The patient is [name].  I  am Dr. Milinda Antis on call.   The patient states she has had a headache on one side of her head for  several days.  Her eye is swollen and painful as well, and it hurts to  move her eye back and forth.  She said she has had a CT and blood tests  which are normal.  She went to her ophthalmologist, who said she may  have some sort of pinched nerve and gave her some steroids which she has  not started on.  She also has pain medicine.  She wanted to know what  her appointment was in Saturday clinic today and then asked since she  has not tried the steroid yet and has pain medicine if she needs to come  in.  I told her that if she wants to try the medication from the  ophthalmologist first before she follows up, that is up to her, but her  pain is severe.  She needs to come in now and if the meantime if her  pain becomes severe, if she develops any neurologic signs or symptoms  like numbness, difficulty speaking, dizziness, etc., that she needs to  go to the emergency room for evaluation.     Marne A. Tower, MD  Electronically Signed    MAT/MedQ  DD: 12/18/2006  DT: 12/18/2006  Job #: [id number]   cc:   Gregary Signs A. Everardo All, MD

## 2010-08-01 NOTE — Op Note (Signed)
NAMELESSLY, STIGLER             ACCOUNT NO.:  [id number]   MEDICAL RECORD NO.:  [id number]          PATIENT TYPE:  INP   LOCATION:  9144                          FACILITY:  [facility]   PHYSICIAN:  Lenoard Aden, M.D.DATE OF BIRTH:  July 14, 1965   DATE OF PROCEDURE:  09/02/2005  DATE OF DISCHARGE:                                 OPERATIVE REPORT   PREOPERATIVE DIAGNOSES:  1.  Thirty-eight intrauterine pregnancy.  2.  History of uterine septum and uterine anomaly.  3.  Previous cesarean section, for repeat.  4.  Desire for elective sterilization.  5.  Breech presentation.   POSTOPERATIVE DIAGNOSES:  1.  Thirty-eight intrauterine pregnancy.  2.  History of uterine septum and uterine anomaly.  3.  Previous cesarean section, for repeat.  4.  Desire for elective sterilization.  5.  Breech presentation.   PROCEDURES:  1.  Repeat low segment transverse cesarean section.  2.  Tubal ligation.   SURGEON:  Lenoard Aden, M.D.   ASSISTANT:  Maxie Better, M.D.   ANESTHESIA:  Spinal by Tyrone Apple. Malen Gauze, M.D.   No complications.  Tubal segment to pathology.   ESTIMATED BLOOD LOSS:  500 mL.   COMPLICATIONS:  None.   BRIEF OPERATIVE NOTE:  After being apprised of the risks of anesthesia,  infection, injury to abdominal organs with need for repair, the failure risk  of tubal ligation 5-10 per thousand, the patient was brought to the  operating room, where she was administered a spinal anesthetic without  complications, prepped and draped in the usual sterile fashion, a Foley  catheter placed.  After achieving adequate anesthesia, dilute Marcaine  solution placed, a Pfannenstiel skin incision made with a scalpel and  carried down to the fascia, which was nicked in the midline and opened  transversely using Mayo scissors.  The rectus muscle was dissected sharply  in the midline, the peritoneum entered sharply and the bladder blade placed.  The visceral peritoneum was scored  sharply off the lower uterine segment, a  Kerr hysterotomy incision made.  Atraumatic delivery of a footling breech  presentation using the usual maneuvers, handed to pediatricians in  attendance, Apgars of 8 and 9.  Cord blood collected, the placenta delivered  manually intact, three-vessel cord noted, the uterus exteriorized and closed  using 0 Monocryl suture in a continuous running fashion.  The left tube was  traced out to the fimbriated end and the ampullary-isthmic portion of the  tube was identified, avascular portion of the mesosalpinx is cauterized,  creating a window, and 0 ties are placed distally and proximally.  Tubal  segments are identified, cauterized, tubal segment removed.  The same  procedure is done on the right tube; however, after removal of the right  tube there is bleeding from a large dilated vein in the mesosalpinx.  This  area is sutured x3 using a 3-0 Vicryl on an SH needle to control bleeding.  Bleeding is well-controlled, subsequent suturing, and Surgicel is placed on  this area, which is reinspected.  The uterus is then replaced into the  abdominal cavity and irrigation is accomplished,  and reinspection reveals no  evidence of active bleeding from the right tubal segments or ovary, which  are directly visualized.  The left side is visualized as well and good  hemostasis is achieved.  The bladder flap is inspected and found to be  hemostatic.  At this time the fascia is closed using a 0 Monocryl in  continuous running fashion.  The skin is closed using staples.  The patient  tolerates the procedure well and is transferred to recovery in good  condition.      Lenoard Aden, M.D.  Electronically Signed     RJT/MEDQ  D:  09/02/2005  T:  09/02/2005  Job:  [id number]

## 2010-08-01 NOTE — Consult Note (Signed)
[facility]                          ENDOCRINOLOGY CONSULTATION   Alyssa, Garner                    MRN:          [id number]  DATE:06/16/2006                            DOB:          [birth year]    REFERRING PHYSICIAN:  Greta O'Buch, PA-C   REASON FOR REFERRAL:  Hypothyroidism.   HISTORY OF PRESENT ILLNESS:  A 44 year old woman who states several  months of fatigue.  She felt it was due to having a new infant but she  was found to have an elevated TSH.  She also has some slight loss of  hair on her head.  She was prescribed Synthroid 88 mcg a day with  improvement in her TSH from 81 down to 16.  Last week, her Synthroid was  increased to 125 mcg a day.   PAST MEDICAL HISTORY:  1. Low back pain.  2. Very mild depression.  3. She states that she is not aware of having had a previous TSH      within the past few years.  4. She has had C-sections with all three of her pregnancies due to      what she describes as an anatomic uterine abnormality.   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  She is married.   FAMILY HISTORY:  She is adopted.   REVIEW OF SYSTEMS:  She has lost almost all of her pregnancy weight  gain.  She has slight cold intolerance.   PHYSICAL EXAMINATION:  VITAL SIGNS:  Blood pressure is 109/74, heart  rate is 84, temperature is 97.3, the height is 5 feet 11 inches, and the  weight is 140.  GENERAL:  No distress.  SKIN:  Not diaphoretic, no rash.  HEENT:  No proptosis.  No periorbital swelling.  Pharynx is normal.  Her  hair distribution on her head appears normal to me.  NECK:  Supple.  No goiter.  CHEST:  Clear to auscultation.  No respiratory distress.  CARDIOVASCULAR:  No edema.  Regular rate and rhythm.  No murmur.  Pedal  pulses are intact.  EXTREMITIES:  No deformity is seen.   LABORATORY STUDIES:  On May 11, 2006, LDL cholesterol 119, also the  TSH values as  described above.   IMPRESSION:  1. 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  2. Minimal hair loss.  I am uncertain if this is thyroid related.  3. Symptoms of fatigue and mild depression, which could possibly be      due to her hypothyroidism.  4. Dyslipidemia may improve with normalization of her thyroid studies.   PLAN:  1. We discussed the natural history of hypothyroidism and I gave her a      brochure.  2. Continue Synthroid at 125 mcg a day.  3. Recheck TSH in about 6 weeks.  4. Return here in about 4months.     Sean A. Everardo All, MD  Electronically Signed    SAE/MedQ  DD: 06/17/2006  DT: 06/17/2006  Job #: [id number]  cc:   Eunice Blase, PA-C  Lenoard Aden, M.D.

## 2010-08-01 NOTE — Discharge Summary (Signed)
NAMEBREIANNA, DELFINO NO.:  [id number]   MEDICAL RECORD NO.:  [id number]          PATIENT TYPE:  INP   LOCATION:  9144                          FACILITY:  [facility]   PHYSICIAN:  Lenoard Aden, M.D.DATE OF BIRTH:  1965/04/26   DATE OF ADMISSION:  09/02/2005  DATE OF DISCHARGE:  09/05/2005                                 DISCHARGE SUMMARY   HOSPITAL COURSE:  Patient underwent uncomplicated repeat low segment  transverse cesarean section and tubal ligation on September 02, 2005.  Postoperative course uncomplicated.  Hemoglobin of 11.8.  Tolerating a  regular diet well.  Ambulated without difficulty.  Discharged to home on day  three.  Discharging teaching done.  At time of discharge medication is to  include Vicodin, Colace, prenatal vitamins, and iron.  Follow up in the  office in four to six weeks.  Incisional care discussed.      Lenoard Aden, M.D.  Electronically Signed     RJT/MEDQ  D:  10/06/2005  T:  10/07/2005  Job:  [id number]

## 2010-08-06 ENCOUNTER — Encounter: Payer: Self-pay | Admitting: Gastroenterology

## 2010-08-06 ENCOUNTER — Ambulatory Visit (INDEPENDENT_AMBULATORY_CARE_PROVIDER_SITE_OTHER): Payer: BC Managed Care – PPO | Admitting: Gastroenterology

## 2010-08-06 ENCOUNTER — Other Ambulatory Visit: Payer: BC Managed Care – PPO

## 2010-08-06 DIAGNOSIS — R140 Abdominal distension (gaseous): Secondary | ICD-10-CM

## 2010-08-06 DIAGNOSIS — R143 Flatulence: Secondary | ICD-10-CM

## 2010-08-06 DIAGNOSIS — R141 Gas pain: Secondary | ICD-10-CM

## 2010-08-06 DIAGNOSIS — K5900 Constipation, unspecified: Secondary | ICD-10-CM

## 2010-08-06 MED ORDER — PEG-KCL-NACL-NASULF-NA ASC-C 100 G PO SOLR: 1.0000 | ORAL | Status: DC

## 2010-08-06 MED ORDER — HYOSCYAMINE SULFATE 0.125 MG SL SUBL: 0.1250 mg | SUBLINGUAL_TABLET | SUBLINGUAL | Status: DC | PRN

## 2010-08-06 NOTE — Progress Notes (Signed)
HPI: This is a  pleasant 45 year old woman  Has myriad upper and lower GI symptoms. Went to [place] in March, symptoms all worsened. Gets abd bloating, swelling, constipated.  Has gained 5 pounds in the past few months.  She has pyrosis.  No overt GI bleeding.    She has eliminated many foods.  Has cut out breads.  Never really had GI symptoms until  2011.   Constipated; she has to really push and strain a lot lately.  Pebbles.  No blood.  Has not tried fiber supplements.  Vegitables are  A problems  She asked me whether anal sex could be causing her symptoms and I explained that perhaps psychologically but not physically could cause problems in her colon, stomach.  Blood tests done one to 2 months ago showed normal CBC, normal sedimentation rate, normal complete metabolic profile   Review of systems: Pertinent positive and negative review of systems were noted in the above HPI section.  All other review of systems was otherwise negative.   Past Medical History, Past Surgical History, Family History, Social History, Current Medications, Allergies were all reviewed with the patient via [facility] electronic medical record system.   Physical Exam: BP 110/80  Pulse 76  Ht 5\' 10"  (1.778 m)  Wt 134 lb (60.782 kg)  BMI 19.23 kg/m2  LMP 07/25/2010 Constitutional: generally well-appearing Psychiatric: alert and oriented x3 Eyes: extraocular movements intact Mouth: oral pharynx moist, no lesions Neck: supple no lymphadenopathy Cardiovascular: heart regular rate and rhythm Lungs: clear to auscultation bilaterally Abdomen: soft, nontender, nondistended, no obvious ascites, no peritoneal signs, normal bowel sounds Extremities: no lower extremity edema bilaterally Skin: no lesions on visible extremities    Assessment and plan: 45 y.o. female with upper and lower GI symptoms  She is under a lot of stress, her ex-husband moved to [place] several months ago and this was right  around the time of her change in bowel habits, bloating. Just took a trip to [place]. Given her change in bowel habits we should proceed with colonoscopy. I recommended she start Miralax 2 scoops once daily. She will get celiac testing. I have given her a prescription for sublingual antispasmodics.

## 2010-08-06 NOTE — Patient Instructions (Addendum)
You will be set up for a colonoscopy with propofol. You will have labs checked today in the basement lab.  Please head down after you check out with the front desk  (celiac panel, total IgA). Please start miralax, OTC, take 2 doses once daily to help contipation. Trial of antispasmodic medicine, take as needed. A copy of this information will be made available to Dr. Everardo All.

## 2010-08-07 LAB — CELIAC PANEL 10
Endomysial Screen: NEGATIVE
Gliadin IgA: 13 U/mL (ref <20)
Gliadin IgG: 22.4 U/mL — ABNORMAL HIGH (ref <20)
Tissue Transglut Ab: 16.5 U/mL (ref <20)
Tissue Transglutaminase Ab, IgA: 8.3 U/mL (ref <20)

## 2010-08-08 ENCOUNTER — Other Ambulatory Visit: Payer: Self-pay

## 2010-08-08 MED ORDER — HYOSCYAMINE SULFATE 0.125 MG SL SUBL: 0.1250 mg | SUBLINGUAL_TABLET | SUBLINGUAL | Status: AC | PRN

## 2010-08-19 ENCOUNTER — Telehealth: Payer: Self-pay | Admitting: Gastroenterology

## 2010-08-19 NOTE — Telephone Encounter (Signed)
Pt was told she would need several days to recover from Endo Colon and she wanted to reschedule.  I did advise her that she should be fine the next day and could go back to work.  She was ok to leave the procedure on for 08/28/10

## 2010-08-27 ENCOUNTER — Other Ambulatory Visit: Payer: Self-pay | Admitting: Gastroenterology

## 2010-08-28 ENCOUNTER — Other Ambulatory Visit: Payer: Self-pay | Admitting: Gastroenterology

## 2010-08-28 ENCOUNTER — Encounter: Payer: BC Managed Care – PPO | Admitting: Gastroenterology

## 2010-08-28 ENCOUNTER — Ambulatory Visit (HOSPITAL_COMMUNITY)
Admission: RE | Admit: 2010-08-28 | Discharge: 2010-08-28 | Disposition: A | Discharge status: Home / Self Care | Payer: BC Managed Care – PPO | Source: Ambulatory Visit | Attending: Gastroenterology | Admitting: Gastroenterology

## 2010-08-28 DIAGNOSIS — K648 Other hemorrhoids: Secondary | ICD-10-CM | POA: Insufficient documentation

## 2010-08-28 DIAGNOSIS — R143 Flatulence: Secondary | ICD-10-CM

## 2010-08-28 DIAGNOSIS — K3189 Other diseases of stomach and duodenum: Secondary | ICD-10-CM

## 2010-08-28 DIAGNOSIS — R142 Eructation: Secondary | ICD-10-CM

## 2010-08-28 DIAGNOSIS — K5900 Constipation, unspecified: Secondary | ICD-10-CM

## 2010-08-28 DIAGNOSIS — R141 Gas pain: Secondary | ICD-10-CM

## 2010-08-28 DIAGNOSIS — R1013 Epigastric pain: Secondary | ICD-10-CM

## 2010-09-24 ENCOUNTER — Other Ambulatory Visit: Payer: Self-pay | Admitting: Endocrinology

## 2011-05-04 ENCOUNTER — Telehealth: Payer: Self-pay | Admitting: Endocrinology

## 2011-05-04 NOTE — Telephone Encounter (Signed)
The pt's mom called and is hoping for a call back to discuss a med her daughter is on.  Her callback number is [phone].  Thanks!

## 2011-05-05 NOTE — Telephone Encounter (Signed)
Pt states that pharmacy states that she needs PA on Retin-A cream. Pt would like call when PA is completed.

## 2011-05-07 ENCOUNTER — Other Ambulatory Visit: Payer: Self-pay | Admitting: Endocrinology

## 2011-05-07 MED ORDER — TRETINOIN 0.025 % EX CREA: TOPICAL_CREAM | Freq: Every day | CUTANEOUS | Status: DC

## 2011-05-07 NOTE — Telephone Encounter (Signed)
Addended by: Romero Belling on: 05/07/2011 08:57 AM   Modules accepted: Orders

## 2011-05-07 NOTE — Telephone Encounter (Signed)
i called pt 05/07/11.  i refilled retin-a

## 2011-05-20 ENCOUNTER — Telehealth: Payer: Self-pay | Admitting: *Deleted

## 2011-05-20 NOTE — Telephone Encounter (Signed)
Pt called stating that she still needs PA for Retin-A cream. Called pharmacy to get PA information to start claim, called insurance company and pt still has a PA approval from 09/19/2009-09/19/2011. Pt's ID # was incorrectly entered into insurance company's system which caused denial of claim. Insurance company is correcting problem now and pharmacy should be able to run claim again in a hour for approval.  Pt informed of information and to callback if needed.

## 2011-05-21 ENCOUNTER — Telehealth: Payer: Self-pay | Admitting: *Deleted

## 2011-05-21 NOTE — Telephone Encounter (Signed)
Pt called regarding PA again, spoke with several people at [facility], system updated and claim was approved according to pharmacy. Pt informed.

## 2011-06-04 ENCOUNTER — Telehealth: Payer: Self-pay | Admitting: Endocrinology

## 2011-06-04 NOTE — Telephone Encounter (Signed)
Going back to 2008, we have no record of this or similar medication.  Where did she get it before?

## 2011-06-04 NOTE — Telephone Encounter (Signed)
Okay to refill? No previous rx on file.

## 2011-06-04 NOTE — Telephone Encounter (Signed)
Pt requesting veramyst--[facility]--pt [phone]

## 2011-06-05 NOTE — Telephone Encounter (Signed)
She was prescribed Veramyst by an allergist before.

## 2011-06-05 NOTE — Telephone Encounter (Signed)
We have no record of this.  Ov would be needed here or with allergist to rx.

## 2011-06-08 NOTE — Telephone Encounter (Signed)
Pt informed of MD's advisement. Pt states that she will callback to make an appointment.

## 2011-07-27 ENCOUNTER — Other Ambulatory Visit: Payer: Self-pay | Admitting: *Deleted

## 2011-07-27 MED ORDER — LEVOTHYROXINE SODIUM 150 MCG PO TABS: 150.0000 ug | ORAL_TABLET | Freq: Every day | ORAL | Status: DC

## 2011-07-27 NOTE — Telephone Encounter (Signed)
R'cd fax from [facility] for refill of Levothyroxine.

## 2011-07-31 ENCOUNTER — Other Ambulatory Visit: Payer: Self-pay | Admitting: Endocrinology

## 2011-11-23 ENCOUNTER — Encounter: Payer: Self-pay | Admitting: Internal Medicine

## 2011-11-23 ENCOUNTER — Other Ambulatory Visit (INDEPENDENT_AMBULATORY_CARE_PROVIDER_SITE_OTHER): Payer: BC Managed Care – PPO

## 2011-11-23 ENCOUNTER — Ambulatory Visit (INDEPENDENT_AMBULATORY_CARE_PROVIDER_SITE_OTHER): Payer: BC Managed Care – PPO | Admitting: Internal Medicine

## 2011-11-23 VITALS — BP 110/80 | HR 80 | Temp 98.0°F | Resp 16 | Wt 136.0 lb

## 2011-11-23 DIAGNOSIS — I889 Nonspecific lymphadenitis, unspecified: Secondary | ICD-10-CM

## 2011-11-23 DIAGNOSIS — E039 Hypothyroidism, unspecified: Secondary | ICD-10-CM

## 2011-11-23 DIAGNOSIS — J309 Allergic rhinitis, unspecified: Secondary | ICD-10-CM | POA: Insufficient documentation

## 2011-11-23 DIAGNOSIS — L708 Other acne: Secondary | ICD-10-CM

## 2011-11-23 LAB — CBC WITH DIFFERENTIAL/PLATELET
Basophils Relative: 0.5 % (ref 0.0–3.0)
Eosinophils Relative: 1.9 % (ref 0.0–5.0)
HCT: 40.4 % (ref 36.0–46.0)
Hemoglobin: 13.4 g/dL (ref 12.0–15.0)
Lymphs Abs: 2.5 10*3/uL (ref 0.7–4.0)
Monocytes Relative: 9.7 % (ref 3.0–12.0)
Neutro Abs: 2.8 10*3/uL (ref 1.4–7.7)
RBC: 4.28 Mil/uL (ref 3.87–5.11)
WBC: 6 10*3/uL (ref 4.5–10.5)

## 2011-11-23 LAB — TSH: TSH: 3.75 u[IU]/mL (ref 0.35–5.50)

## 2011-11-23 MED ORDER — TRETINOIN 0.025 % EX CREA: TOPICAL_CREAM | Freq: Every day | CUTANEOUS | Status: DC

## 2011-11-23 MED ORDER — CEFUROXIME AXETIL 500 MG PO TABS: 500.0000 mg | ORAL_TABLET | Freq: Two times a day (BID) | ORAL | Status: AC

## 2011-11-23 MED ORDER — BECLOMETHASONE DIPROPIONATE 80 MCG/ACT NA AERS: 2.0000 | INHALATION_SPRAY | Freq: Every day | NASAL | Status: DC

## 2011-11-23 NOTE — Progress Notes (Signed)
Subjective:    Patient ID: Alyssa Garner, female    DOB: 11-25-1965, 46 y.o.   MRN: [id number]  Sinusitis This is a new problem. The current episode started in the past 7 days. The problem has been gradually worsening since onset. There has been no fever. Her pain is at a severity of 0/10. She is experiencing no pain. Associated symptoms include chills, congestion, ear pain, sinus pressure and swollen glands. Pertinent negatives include no coughing, diaphoresis, headaches, hoarse voice, neck pain, shortness of breath, sneezing or sore throat.  Thyroid Problem Presents for follow-up visit. Symptoms include fatigue, hair loss and weight gain. Patient reports no anxiety, cold intolerance, constipation, depressed mood, diaphoresis, diarrhea, dry skin, heat intolerance, hoarse voice, leg swelling, menstrual problem, nail problem, palpitations, tremors, visual change or weight loss. The symptoms have been worsening.      Review of Systems  Constitutional: Positive for chills, weight gain and fatigue. Negative for fever, weight loss, diaphoresis, activity change, appetite change and unexpected weight change.  HENT: Positive for ear pain, congestion, rhinorrhea, postnasal drip and sinus pressure. Negative for hearing loss, nosebleeds, sore throat, hoarse voice, facial swelling, sneezing, trouble swallowing, neck pain, voice change, tinnitus and ear discharge.   Eyes: Negative.   Respiratory: Negative for cough, choking, chest tightness, shortness of breath, wheezing and stridor.   Cardiovascular: Negative for chest pain, palpitations and leg swelling.  Gastrointestinal: Negative for nausea, vomiting, abdominal pain, diarrhea, constipation and anal bleeding.  Genitourinary: Negative.  Negative for menstrual problem.  Musculoskeletal: Negative for myalgias, back pain, joint swelling, arthralgias and gait problem.  Skin: Negative for color change, pallor, rash and wound.  Neurological: Negative.   Negative for tremors and headaches.  Hematological: Positive for adenopathy (painful neck glands). Negative for cold intolerance and heat intolerance. Does not bruise/bleed easily.  Psychiatric/Behavioral: Negative.        Objective:   Physical Exam  Vitals reviewed. Constitutional: She is oriented to person, place, and time. She appears well-developed and well-nourished.  Non-toxic appearance. She does not have a sickly appearance. She does not appear ill. No distress.  HENT:  Head: Normocephalic and atraumatic. No trismus in the jaw.  Right Ear: Hearing, tympanic membrane, external ear and ear canal normal.  Left Ear: Hearing, tympanic membrane, external ear and ear canal normal.  Nose: Mucosal edema present. No rhinorrhea, sinus tenderness or nasal deformity. No epistaxis.  No foreign bodies. Right sinus exhibits maxillary sinus tenderness. Right sinus exhibits no frontal sinus tenderness. Left sinus exhibits maxillary sinus tenderness. Left sinus exhibits no frontal sinus tenderness.  Mouth/Throat: Oropharynx is clear and moist and mucous membranes are normal. Mucous membranes are not pale, not dry and not cyanotic. No oral lesions. No uvula swelling. No oropharyngeal exudate, posterior oropharyngeal edema, posterior oropharyngeal erythema or tonsillar abscesses.  Eyes: Conjunctivae are normal. Right eye exhibits no discharge. Left eye exhibits no discharge. No scleral icterus.  Neck: Normal range of motion. Neck supple. No JVD present. No tracheal deviation present. No thyromegaly present.  Cardiovascular: Normal rate, regular rhythm, normal heart sounds and intact distal pulses.  Exam reveals no gallop and no friction rub.   No murmur heard. Pulmonary/Chest: Effort normal and breath sounds normal. No stridor. No respiratory distress. She has no wheezes. She has no rales. She exhibits no tenderness.  Abdominal: Soft. Bowel sounds are normal. She exhibits no distension and no mass. There is  no tenderness. There is no rebound and no guarding.  Musculoskeletal: Normal range of motion.  She exhibits no edema and no tenderness.  Lymphadenopathy:       Head (right side): No submental, no submandibular, no tonsillar, no preauricular, no posterior auricular and no occipital adenopathy present.       Head (left side): No submental, no submandibular, no tonsillar, no preauricular, no posterior auricular and no occipital adenopathy present.    She has cervical adenopathy.       Right cervical: Superficial cervical adenopathy present. No deep cervical and no posterior cervical adenopathy present.      Left cervical: Superficial cervical adenopathy present. No deep cervical and no posterior cervical adenopathy present.    She has no axillary adenopathy.       Right: No inguinal, no supraclavicular and no epitrochlear adenopathy present.       Left: No inguinal, no supraclavicular and no epitrochlear adenopathy present.  Neurological: She is oriented to person, place, and time.  Skin: Skin is warm and dry. No rash noted. She is not diaphoretic. No erythema. No pallor.  Psychiatric: She has a normal mood and affect. Her behavior is normal. Judgment and thought content normal.     Lab Results  Component Value Date   WBC 8.7 06/10/2010   HGB 12.9 06/10/2010   HCT 37.9 06/10/2010   PLT 250.0 06/10/2010   GLUCOSE 86 06/10/2010   CHOL 168 06/10/2010   TRIG 90.0 06/10/2010   HDL 77.30 06/10/2010   LDLDIRECT 96.9 06/13/2009   LDLCALC 73 06/10/2010   ALT 16 06/10/2010   AST 23 06/10/2010   NA 142 06/10/2010   K 4.7 06/10/2010   CL 105 06/10/2010   CREATININE 0.9 06/10/2010   BUN 14 06/10/2010   CO2 33* 06/10/2010   TSH 3.71 06/10/2010       Assessment & Plan:

## 2011-11-23 NOTE — Assessment & Plan Note (Signed)
Continue Retin A 

## 2011-11-23 NOTE — Assessment & Plan Note (Signed)
I will check her TSH and will adjust the dose if needed 

## 2011-11-23 NOTE — Assessment & Plan Note (Signed)
Start Qnasl 

## 2011-11-23 NOTE — Patient Instructions (Signed)
Hypothyroidism The thyroid is a large gland located in the lower front of your neck. The thyroid gland helps control metabolism. Metabolism is how your body handles food. It controls metabolism with the hormone thyroxine. When this gland is underactive (hypothyroid), it produces too little hormone.  CAUSES These include:   Absence or destruction of thyroid tissue.   Goiter due to iodine deficiency.   Goiter due to medications.   Congenital defects (since birth).   Problems with the pituitary. This causes a lack of TSH (thyroid stimulating hormone). This hormone tells the thyroid to turn out more hormone.  SYMPTOMS  Lethargy (feeling as though you have no energy)   Cold intolerance   Weight gain (in spite of normal food intake)   Dry skin   Coarse hair   Menstrual irregularity (if severe, may lead to infertility)   Slowing of thought processes  Cardiac problems are also caused by insufficient amounts of thyroid hormone. Hypothyroidism in the newborn is cretinism, and is an extreme form. It is important that this form be treated adequately and immediately or it will lead rapidly to retarded physical and mental development. DIAGNOSIS  To prove hypothyroidism, your caregiver may do blood tests and ultrasound tests. Sometimes the signs are hidden. It may be necessary for your caregiver to watch this illness with blood tests either before or after diagnosis and treatment. TREATMENT  Low levels of thyroid hormone are increased by using synthetic thyroid hormone. This is a safe, effective treatment. It usually takes about four weeks to gain the full effects of the medication. After you have the full effect of the medication, it will generally take another four weeks for problems to leave. Your caregiver may start you on low doses. If you have had heart problems the dose may be gradually increased. It is generally not an emergency to get rapidly to normal. HOME CARE INSTRUCTIONS   Take  your medications as your caregiver suggests. Let your caregiver know of any medications you are taking or start taking. Your caregiver will help you with dosage schedules.   As your condition improves, your dosage needs may increase. It will be necessary to have continuing blood tests as suggested by your caregiver.   Report all suspected medication side effects to your caregiver.  SEEK MEDICAL CARE IF: Seek medical care if you develop:  Sweating.   Tremulousness (tremors).   Anxiety.   Rapid weight loss.   Heat intolerance.   Emotional swings.   Diarrhea.   Weakness.  SEEK IMMEDIATE MEDICAL CARE IF:  You develop chest pain, an irregular heart beat (palpitations), or a rapid heart beat. MAKE SURE YOU:   Understand these instructions.   Will watch your condition.   Will get help right away if you are not doing well or get worse.  Document Released: 03/02/2005 Document Revised: 02/19/2011 Document Reviewed: 10/21/2007 [facility] Patient Information 2012 [place], [place].Lymphadenopathy Lymphadenopathy means "disease of the lymph glands." But the term is usually used to describe swollen or enlarged lymph glands, also called lymph nodes. These are the bean-shaped organs found in many locations including the neck, underarm, and groin. Lymph glands are part of the immune system, which fights infections in your body. Lymphadenopathy can occur in just one area of the body, such as the neck, or it can be generalized, with lymph node enlargement in several areas. The nodes found in the neck are the most common sites of lymphadenopathy. CAUSES  When your immune system responds to germs (such as  viruses or bacteria ), infection-fighting cells and fluid build up. This causes the glands to grow in size. This is usually not something to worry about. Sometimes, the glands themselves can become infected and inflamed. This is called lymphadenitis. Enlarged lymph nodes can be caused by many  diseases:  Bacterial disease, such as strep throat or a skin infection.   Viral disease, such as a common cold.   Other germs, such as lyme disease, tuberculosis, or sexually transmitted diseases.   Cancers, such as lymphoma (cancer of the lymphatic system) or leukemia (cancer of the white blood cells).   Inflammatory diseases such as lupus or rheumatoid arthritis.   Reactions to medications.  Many of the diseases above are rare, but important. This is why you should see your caregiver if you have lymphadenopathy. SYMPTOMS   Swollen, enlarged lumps in the neck, back of the head or other locations.   Tenderness.   Warmth or redness of the skin over the lymph nodes.   Fever.  DIAGNOSIS  Enlarged lymph nodes are often near the source of infection. They can help healthcare providers diagnose your illness. For instance:   Swollen lymph nodes around the jaw might be caused by an infection in the mouth.   Enlarged glands in the neck often signal a throat infection.   Lymph nodes that are swollen in more than one area often indicate an illness caused by a virus.  Your caregiver most likely will know what is causing your lymphadenopathy after listening to your history and examining you. Blood tests, x-rays or other tests may be needed. If the cause of the enlarged lymph node cannot be found, and it does not go away by itself, then a biopsy may be needed. Your caregiver will discuss this with you. TREATMENT  Treatment for your enlarged lymph nodes will depend on the cause. Many times the nodes will shrink to normal size by themselves, with no treatment. Antibiotics or other medicines may be needed for infection. Only take over-the-counter or prescription medicines for pain, discomfort or fever as directed by your caregiver. HOME CARE INSTRUCTIONS  Swollen lymph glands usually return to normal when the underlying medical condition goes away. If they persist, contact your health-care provider.  He/she might prescribe antibiotics or other treatments, depending on the diagnosis. Take any medications exactly as prescribed. Keep any follow-up appointments made to check on the condition of your enlarged nodes.  SEEK MEDICAL CARE IF:   Swelling lasts for more than two weeks.   You have symptoms such as weight loss, night sweats, fatigue or fever that does not go away.   The lymph nodes are hard, seem fixed to the skin or are growing rapidly.   Skin over the lymph nodes is red and inflamed. This could mean there is an infection.  SEEK IMMEDIATE MEDICAL CARE IF:   Fluid starts leaking from the area of the enlarged lymph node.   You develop a fever of 102 F (38.9 C) or greater.   Severe pain develops (not necessarily at the site of a large lymph node).   You develop chest pain or shortness of breath.   You develop worsening abdominal pain.  MAKE SURE YOU:   Understand these instructions.   Will watch your condition.   Will get help right away if you are not doing well or get worse.  Document Released: 12/10/2007 Document Revised: 02/19/2011 Document Reviewed: 12/10/2007 [facility] Patient Information 2012 [place], [place].

## 2011-11-23 NOTE — Assessment & Plan Note (Signed)
I will check her labs to look for any dyscrasias in her blood with a CBC and as ESR and a mono spot test for EBV infection, and will treat her for bacterial causes with ceftin

## 2011-11-24 ENCOUNTER — Telehealth: Payer: Self-pay | Admitting: Endocrinology

## 2011-11-24 NOTE — Telephone Encounter (Signed)
Pt is requesting results from yesterday's labs.  Pt is aware a letter has been sent.  She would like someone to call her.

## 2011-11-24 NOTE — Telephone Encounter (Signed)
Pt informed of lab results. 

## 2012-05-13 ENCOUNTER — Telehealth: Payer: Self-pay | Admitting: Internal Medicine

## 2012-05-13 NOTE — Telephone Encounter (Signed)
Patient requested refill on her Retin-A cream and was told by her pharmacy, [facility], that it requires PA with her insurance, she is calling to check the status of this

## 2012-05-13 NOTE — Telephone Encounter (Signed)
No rejected claim received from pharmacy as of yet. This is needed to initiate PA process, need rejection information from pharmacy.

## 2012-05-18 NOTE — Telephone Encounter (Signed)
Called for PA form, completed and faxed back to company to approve Retin-A

## 2012-05-23 NOTE — Telephone Encounter (Signed)
Received fax from [facility] stating that they do not handle PA for this pt. Will need to research which company does or pt need to have pharmacy submit a claim rejection.

## 2012-05-24 NOTE — Telephone Encounter (Signed)
Called [employer] per insurance card on file and advised that policy terminated 03/15/12. Unable to obtain prior auth and no updated insurance information.

## 2012-05-25 ENCOUNTER — Telehealth: Payer: Self-pay | Admitting: Endocrinology

## 2012-05-25 NOTE — Telephone Encounter (Signed)
The patient called the triage line at [facility] and is upset about her Retin-A prescription not being authorized.  She is hoping to get the prior-auth approved.  Her call back number is [phone]

## 2012-05-25 NOTE — Telephone Encounter (Signed)
i last saw this pt almost 2 years ago.  Please advise ov.  Also, please obtain pa form

## 2012-05-26 ENCOUNTER — Other Ambulatory Visit: Payer: Self-pay | Admitting: Endocrinology

## 2012-05-26 MED ORDER — TRETINOIN 0.025 % EX CREA: TOPICAL_CREAM | Freq: Every day | CUTANEOUS | Status: DC

## 2012-05-26 NOTE — Telephone Encounter (Signed)
i have sent a prescription to your pharmacy 

## 2012-05-26 NOTE — Telephone Encounter (Signed)
I called the patient to advise of need for ov and this was scheduled for 06/01/12 @ 4:15pm.  The patient is requesting that the Retin-A rx be called into her pharmacy since she has made an appt to be seen.  The patient may be reached at [phone].

## 2012-06-01 ENCOUNTER — Ambulatory Visit (INDEPENDENT_AMBULATORY_CARE_PROVIDER_SITE_OTHER): Payer: BC Managed Care – PPO | Admitting: Endocrinology

## 2012-06-01 ENCOUNTER — Encounter: Payer: Self-pay | Admitting: Endocrinology

## 2012-06-01 VITALS — BP 126/74 | HR 78 | Wt 142.0 lb

## 2012-06-01 MED ORDER — LEVOTHYROXINE SODIUM 150 MCG PO TABS: 150.0000 ug | ORAL_TABLET | Freq: Every day | ORAL | Status: DC

## 2012-06-01 MED ORDER — TRETINOIN 0.1 % EX CREA: TOPICAL_CREAM | Freq: Every day | CUTANEOUS | Status: DC

## 2012-06-01 NOTE — Patient Instructions (Signed)
Our office will request prior authorization for the retin-a.   i have refilled your other medications.

## 2012-06-01 NOTE — Progress Notes (Signed)
  Subjective:    Patient ID: Alyssa Garner, female    DOB: 1966-02-13, 47 y.o.   MRN: [id number]  HPI Pt reports many years h/o acne, worst on the face and ears, with assoc cysts.  She failed oral abx and benzoyl peroxide, but retin-a works well.  She has taken retin-a x approx 10 years.  She has no h/o rosacea, and has never been on topical metronidazole or cleocin.   Past Medical History  Diagnosis Date  . Long-term current use of steroids   . HYPOTHYROIDISM 04/09/2008  . LOW BACK PAIN 12/13/2006  . LYMPHADENOPATHY 06/13/2009    Past Surgical History  Procedure Laterality Date  . Tubal ligation      History   Social History  . Marital Status: Married    Spouse Name: N/A    Number of Children: 3  . Years of Education: N/A   Occupational History  .  [employer]   Social History Main Topics  . Smoking status: Former Games developer  . Smokeless tobacco: Never Used  . Alcohol Use: No     Comment: socially  . Drug Use: No  . Sexually Active: Yes   Other Topics Concern  . Not on file   Social History Narrative  . No narrative on file    Current Outpatient Prescriptions on File Prior to Visit  Medication Sig Dispense Refill  . Beclomethasone Dipropionate (QNASL) 80 MCG/ACT AERS Place 2 puffs into the nose daily.  1 Inhaler  11  . FLUoxetine (PROZAC) 20 MG capsule Take by mouth. 2 tabs daily       No current facility-administered medications on file prior to visit.    No Known Allergies  Family History  Problem Relation Age of Onset  . Adopted: Yes    BP 126/74  Pulse 78  Wt 142 lb (64.411 kg)  BMI 20.37 kg/m2  SpO2 98%  Review of Systems No h/o psoriatic plaques.      Objective:   Physical Exam VITAL SIGNS:  See vs page GENERAL: no distress Face: no evidence of acne now.   NECK: There is no palpable thyroid enlargement.  No thyroid nodule is palpable.  No palpable lymphadenopathy at the anterior neck.   Lab Results  Component Value Date   TSH  3.75 11/23/2011      Assessment & Plan:  Hypothyroidism, well-replaced Acne, well-controlled

## 2012-06-03 ENCOUNTER — Telehealth: Payer: Self-pay | Admitting: *Deleted

## 2012-06-03 NOTE — Telephone Encounter (Signed)
PA approved for pt's rx of Trentinoin (Retin-A) crm. Approved from 05/04/12 to 06/03/13. Case [id number]. [place]

## 2012-09-05 ENCOUNTER — Other Ambulatory Visit: Payer: Self-pay

## 2012-09-05 DIAGNOSIS — Z9882 Breast implant status: Secondary | ICD-10-CM

## 2012-09-05 DIAGNOSIS — Z1231 Encounter for screening mammogram for malignant neoplasm of breast: Secondary | ICD-10-CM

## 2012-09-19 ENCOUNTER — Ambulatory Visit
Admission: RE | Admit: 2012-09-19 | Discharge: 2012-09-19 | Disposition: A | Discharge status: Home / Self Care | Payer: BC Managed Care – PPO | Source: Ambulatory Visit

## 2012-09-19 DIAGNOSIS — Z9882 Breast implant status: Secondary | ICD-10-CM

## 2012-09-19 DIAGNOSIS — Z1231 Encounter for screening mammogram for malignant neoplasm of breast: Secondary | ICD-10-CM

## 2012-09-22 ENCOUNTER — Other Ambulatory Visit: Payer: Self-pay | Admitting: Obstetrics and Gynecology

## 2012-09-22 DIAGNOSIS — R928 Other abnormal and inconclusive findings on diagnostic imaging of breast: Secondary | ICD-10-CM

## 2012-09-29 ENCOUNTER — Ambulatory Visit
Admission: RE | Admit: 2012-09-29 | Discharge: 2012-09-29 | Disposition: A | Discharge status: Home / Self Care | Payer: BC Managed Care – PPO | Source: Ambulatory Visit | Attending: Obstetrics and Gynecology | Admitting: Obstetrics and Gynecology

## 2012-09-29 DIAGNOSIS — R928 Other abnormal and inconclusive findings on diagnostic imaging of breast: Secondary | ICD-10-CM

## 2012-11-02 ENCOUNTER — Encounter (HOSPITAL_COMMUNITY): Payer: Self-pay

## 2012-11-02 NOTE — Progress Notes (Signed)
Pt denies using steroid medication for her nose.

## 2012-11-03 ENCOUNTER — Other Ambulatory Visit: Payer: Self-pay | Admitting: Obstetrics and Gynecology

## 2012-11-10 ENCOUNTER — Ambulatory Visit (HOSPITAL_COMMUNITY)
Admission: RE | Admit: 2012-11-10 | Payer: BC Managed Care – PPO | Source: Ambulatory Visit | Admitting: Obstetrics and Gynecology

## 2012-11-10 HISTORY — DX: Gastro-esophageal reflux disease without esophagitis: K21.9

## 2012-11-10 HISTORY — DX: Age-related osteoporosis without current pathological fracture: M81.0

## 2012-11-10 HISTORY — DX: Anxiety disorder, unspecified: F41.9

## 2012-11-10 SURGERY — DILATATION & CURETTAGE/HYSTEROSCOPY WITH HYDROTHERMAL ABLATION
Anesthesia: Choice

## 2013-05-17 ENCOUNTER — Other Ambulatory Visit: Payer: Self-pay | Admitting: Endocrinology

## 2013-06-09 ENCOUNTER — Other Ambulatory Visit: Payer: Self-pay | Admitting: Endocrinology

## 2013-06-18 ENCOUNTER — Other Ambulatory Visit: Payer: Self-pay | Admitting: Endocrinology

## 2013-09-17 ENCOUNTER — Other Ambulatory Visit: Payer: Self-pay | Admitting: Endocrinology

## 2013-09-18 ENCOUNTER — Other Ambulatory Visit: Payer: Self-pay | Admitting: Endocrinology

## 2013-09-18 MED ORDER — LEVOTHYROXINE SODIUM 150 MCG PO TABS: 150.0000 ug | ORAL_TABLET | Freq: Every day | ORAL | Status: DC

## 2013-09-27 ENCOUNTER — Other Ambulatory Visit: Payer: Self-pay

## 2013-09-27 DIAGNOSIS — Z1231 Encounter for screening mammogram for malignant neoplasm of breast: Secondary | ICD-10-CM

## 2013-10-05 ENCOUNTER — Ambulatory Visit
Admission: RE | Admit: 2013-10-05 | Discharge: 2013-10-05 | Disposition: A | Discharge status: Home / Self Care | Payer: BC Managed Care – PPO | Source: Ambulatory Visit

## 2013-10-05 DIAGNOSIS — Z1231 Encounter for screening mammogram for malignant neoplasm of breast: Secondary | ICD-10-CM

## 2013-12-26 ENCOUNTER — Encounter (HOSPITAL_COMMUNITY): Payer: Self-pay | Admitting: Emergency Medicine

## 2013-12-26 ENCOUNTER — Emergency Department (HOSPITAL_COMMUNITY)
Admission: EM | Admit: 2013-12-26 | Discharge: 2013-12-26 | Disposition: A | Discharge status: Home / Self Care | Payer: BC Managed Care – PPO | Source: Home / Self Care | Attending: Family Medicine | Admitting: Family Medicine

## 2013-12-26 DIAGNOSIS — J029 Acute pharyngitis, unspecified: Secondary | ICD-10-CM

## 2013-12-26 LAB — POCT RAPID STREP A: STREPTOCOCCUS, GROUP A SCREEN (DIRECT): NEGATIVE

## 2013-12-26 NOTE — ED Notes (Signed)
Went with instructions to patient's room, no one in room.  Did not notify staff of leaving.

## 2013-12-26 NOTE — ED Notes (Signed)
Patient requested nurse.  This nurse received report from ramon, cma.  Patient asking how long before strep result, this nurse checked with lab.  Instructed patient another 2 minutes before test would be resulted

## 2013-12-26 NOTE — ED Provider Notes (Signed)
Alyssa Garner is a 48 y.o. female who presents to Urgent Care today for sore throat. Patient has had a one-day history of sore throat. 2 of her children have strep throat. She notes mild fatigue. No nausea vomiting or diarrhea. No medications tried yet. Symptoms are mild to moderate.   Past Medical History  Diagnosis Date  . Long-term current use of steroids   . HYPOTHYROIDISM 04/09/2008  . LOW BACK PAIN 12/13/2006  . Anxiety   . GERD (gastroesophageal reflux disease)     occ OTC  . Osteoporosis     no treatment   History  Substance Use Topics  . Smoking status: Former Games developermoker  . Smokeless tobacco: Never Used  . Alcohol Use: Yes     Comment: socially   ROS as above Medications: No current facility-administered medications for this encounter.   Current Outpatient Prescriptions  Medication Sig Dispense Refill  . Beclomethasone Dipropionate (QNASL) 80 MCG/ACT AERS Place 2 puffs into the nose daily.  1 Inhaler  11  . FLUoxetine (PROZAC) 20 MG capsule Take by mouth. 2 tabs daily      . levothyroxine (SYNTHROID, LEVOTHROID) 150 MCG tablet Take 1 tablet (150 mcg total) by mouth daily.  30 tablet  11  . tretinoin (RETIN-A) 0.1 % cream Apply topically at bedtime.  90 g  11  . tretinoin (RETIN-A) 0.1 % cream APPLY TOPICALLY AT BEDTIME.  90 g  10    Exam:  BP 142/92  Pulse 74  Temp(Src) 97.9 F (36.6 C) (Oral)  Resp 18  SpO2 99%  LMP 12/12/2013 Gen: Well NAD HEENT: EOMI,  MMM posterior pharynx is mildly erythematous. Normal tympanic membranes bilaterally. Tender to palpation bilateral cervical adenopathy is present Lungs: Normal work of breathing. CTABL Heart: RRR no MRG Abd: NABS, Soft. Nondistended, Nontender Exts: Brisk capillary refill, warm and well perfused.   Results for orders placed during the hospital encounter of 12/26/13 (from the past 24 hour(s))  POCT RAPID STREP A ([facility]RG CARE ONLY)     Status: None   Collection Time    12/26/13  1:15 PM      Result Value  Ref Range   Streptococcus, Group A Screen (Direct) NEGATIVE  NEGATIVE   No results found.  Assessment and Plan: 48 y.o. female with pharyngitis. Treat with Tylenol and ibuprofen. Culture pending.  Discussed warning signs or symptoms. Please see discharge instructions. Patient expresses understanding.     Rodolph BongEvan S Gaylia Kassel, MD 12/26/13 [phone]

## 2013-12-26 NOTE — Discharge Instructions (Signed)
Thank you for coming in today. °Call or go to the emergency room if you get worse, have trouble breathing, have chest pains, or palpitations.  ° °Pharyngitis °Pharyngitis is redness, pain, and swelling (inflammation) of your pharynx.  °CAUSES  °Pharyngitis is usually caused by infection. Most of the time, these infections are from viruses (viral) and are part of a cold. However, sometimes pharyngitis is caused by bacteria (bacterial). Pharyngitis can also be caused by allergies. Viral pharyngitis may be spread from person to person by coughing, sneezing, and personal items or utensils (cups, forks, spoons, toothbrushes). Bacterial pharyngitis may be spread from person to person by more intimate contact, such as kissing.  °SIGNS AND SYMPTOMS  °Symptoms of pharyngitis include:   °· Sore throat.   °· Tiredness (fatigue).   °· Low-grade fever.   °· Headache. °· Joint pain and muscle aches. °· Skin rashes. °· Swollen lymph nodes. °· Plaque-like film on throat or tonsils (often seen with bacterial pharyngitis). °DIAGNOSIS  °Your health care provider will ask you questions about your illness and your symptoms. Your medical history, along with a physical exam, is often all that is needed to diagnose pharyngitis. Sometimes, a rapid strep test is done. Other lab tests may also be done, depending on the suspected cause.  °TREATMENT  °Viral pharyngitis will usually get better in 3-4 days without the use of medicine. Bacterial pharyngitis is treated with medicines that kill germs (antibiotics).  °HOME CARE INSTRUCTIONS  °· Drink enough water and fluids to keep your urine clear or pale yellow.   °· Only take over-the-counter or prescription medicines as directed by your health care provider:   °¨ If you are prescribed antibiotics, make sure you finish them even if you start to feel better.   °¨ Do not take aspirin.   °· Get lots of rest.   °· Gargle with 8 oz of salt water (½ tsp of salt per 1 qt of water) as often as every 1-2  hours to soothe your throat.   °· Throat lozenges (if you are not at risk for choking) or sprays may be used to soothe your throat. °SEEK MEDICAL CARE IF:  °· You have large, tender lumps in your neck. °· You have a rash. °· You cough up green, yellow-brown, or bloody spit. °SEEK IMMEDIATE MEDICAL CARE IF:  °· Your neck becomes stiff. °· You drool or are unable to swallow liquids. °· You vomit or are unable to keep medicines or liquids down. °· You have severe pain that does not go away with the use of recommended medicines. °· You have trouble breathing (not caused by a stuffy nose). °MAKE SURE YOU:  °· Understand these instructions. °· Will watch your condition. °· Will get help right away if you are not doing well or get worse. °Document Released: 03/02/2005 Document Revised: 12/21/2012 Document Reviewed: 11/07/2012 °[employer]® Patient Information ©2015 [employer]. This information is not intended to replace advice given to you by your health care provider. Make sure you discuss any questions you have with your health care provider. ° ° ° °

## 2013-12-26 NOTE — ED Notes (Signed)
C/o ST onset today Sx include: HA, fatigue 2 of her children are being treated for pos strep Denies f/v/d Alert, no signs of acute distress.

## 2013-12-28 LAB — CULTURE, GROUP A STREP

## 2014-04-18 ENCOUNTER — Encounter: Payer: Self-pay | Admitting: Endocrinology

## 2014-04-18 ENCOUNTER — Ambulatory Visit (INDEPENDENT_AMBULATORY_CARE_PROVIDER_SITE_OTHER): Payer: BLUE CROSS/BLUE SHIELD | Admitting: Endocrinology

## 2014-04-18 VITALS — BP 132/86 | HR 74 | Temp 98.3°F | Ht 70.0 in | Wt 145.0 lb

## 2014-04-18 DIAGNOSIS — E039 Hypothyroidism, unspecified: Secondary | ICD-10-CM

## 2014-04-18 DIAGNOSIS — J349 Unspecified disorder of nose and nasal sinuses: Secondary | ICD-10-CM

## 2014-04-18 DIAGNOSIS — Z0000 Encounter for general adult medical examination without abnormal findings: Secondary | ICD-10-CM

## 2014-04-18 LAB — URINALYSIS, ROUTINE W REFLEX MICROSCOPIC
Bilirubin Urine: NEGATIVE
LEUKOCYTES UA: NEGATIVE
NITRITE: NEGATIVE
Specific Gravity, Urine: 1.01 (ref 1.000–1.030)
TOTAL PROTEIN, URINE-UPE24: NEGATIVE
UROBILINOGEN UA: 0.2 (ref 0.0–1.0)
Urine Glucose: NEGATIVE
pH: 6.5 (ref 5.0–8.0)

## 2014-04-18 LAB — CBC WITH DIFFERENTIAL/PLATELET
Basophils Absolute: 0 10*3/uL (ref 0.0–0.1)
Basophils Relative: 0.5 % (ref 0.0–3.0)
EOS PCT: 1.4 % (ref 0.0–5.0)
Eosinophils Absolute: 0.1 10*3/uL (ref 0.0–0.7)
HCT: 35.9 % — ABNORMAL LOW (ref 36.0–46.0)
Hemoglobin: 12.3 g/dL (ref 12.0–15.0)
LYMPHS ABS: 1.7 10*3/uL (ref 0.7–4.0)
Lymphocytes Relative: 26.1 % (ref 12.0–46.0)
MCHC: 34.2 g/dL (ref 30.0–36.0)
MCV: 93.3 fl (ref 78.0–100.0)
Monocytes Absolute: 0.5 10*3/uL (ref 0.1–1.0)
Monocytes Relative: 7.6 % (ref 3.0–12.0)
Neutro Abs: 4.3 10*3/uL (ref 1.4–7.7)
Neutrophils Relative %: 64.4 % (ref 43.0–77.0)
Platelets: 229 10*3/uL (ref 150.0–400.0)
RBC: 3.85 Mil/uL — AB (ref 3.87–5.11)
RDW: 14 % (ref 11.5–15.5)
WBC: 6.7 10*3/uL (ref 4.0–10.5)

## 2014-04-18 LAB — LIPID PANEL
Cholesterol: 184 mg/dL (ref 0–200)
HDL: 103.7 mg/dL (ref >39.00)
LDL Cholesterol: 67 mg/dL (ref 0–99)
NonHDL: 80.3
Total CHOL/HDL Ratio: 2
Triglycerides: 68 mg/dL (ref 0.0–149.0)
VLDL: 13.6 mg/dL (ref 0.0–40.0)

## 2014-04-18 LAB — BASIC METABOLIC PANEL
BUN: 17 mg/dL (ref 6–23)
CALCIUM: 9 mg/dL (ref 8.4–10.5)
CO2: 29 mEq/L (ref 19–32)
Chloride: 100 mEq/L (ref 96–112)
Creatinine, Ser: 0.99 mg/dL (ref 0.40–1.20)
GFR: 63.73 mL/min (ref >60.00)
GLUCOSE: 82 mg/dL (ref 70–99)
POTASSIUM: 4.4 meq/L (ref 3.5–5.1)
SODIUM: 135 meq/L (ref 135–145)

## 2014-04-18 LAB — HEPATIC FUNCTION PANEL
ALBUMIN: 4.3 g/dL (ref 3.5–5.2)
ALK PHOS: 31 U/L — AB (ref 39–117)
ALT: 16 U/L (ref 0–35)
AST: 23 U/L (ref 0–37)
BILIRUBIN TOTAL: 0.5 mg/dL (ref 0.2–1.2)
Bilirubin, Direct: 0.1 mg/dL (ref 0.0–0.3)
TOTAL PROTEIN: 6.9 g/dL (ref 6.0–8.3)

## 2014-04-18 LAB — TSH: TSH: 6.05 u[IU]/mL — ABNORMAL HIGH (ref 0.35–4.50)

## 2014-04-18 MED ORDER — LINACLOTIDE 290 MCG PO CAPS: 290.0000 ug | ORAL_CAPSULE | Freq: Every day | ORAL | Status: DC

## 2014-04-18 MED ORDER — LEVOTHYROXINE SODIUM 175 MCG PO TABS: 175.0000 ug | ORAL_TABLET | Freq: Every day | ORAL | Status: DC

## 2014-04-18 NOTE — Patient Instructions (Addendum)
blood tests are being requested for you today.  We'll let you know about the results. i have sent a prescription to your pharmacy, for the stomach symptoms. Please see and ear-nose-throat specialist.  you will receive a phone call, about a day and time for an appointment.

## 2014-04-18 NOTE — Progress Notes (Signed)
   Subjective:    Patient ID: Alyssa Garner, female    DOB: 27-Feb-1966, 49 y.o.   MRN: [id number]  HPI Pt states few months of intermittent moderate bloating throughout the abdomen, and assoc constipation. LMP: now. Past Medical History  Diagnosis Date  . Long-term current use of steroids   . HYPOTHYROIDISM 04/09/2008  . LOW BACK PAIN 12/13/2006  . Anxiety   . GERD (gastroesophageal reflux disease)     occ OTC  . Osteoporosis     no treatment    Past Surgical History  Procedure Laterality Date  . Tubal ligation    . Cesarean section      times three  . Breast enhancement surgery      History   Social History  . Marital Status: Married    Spouse Name: N/A    Number of Children: 3  . Years of Education: N/A   Occupational History  .  [employer]   Social History Main Topics  . Smoking status: Former Games developermoker  . Smokeless tobacco: Never Used  . Alcohol Use: Yes     Comment: socially  . Drug Use: No  . Sexual Activity: Yes   Other Topics Concern  . Not on file   Social History Narrative    Current Outpatient Prescriptions on File Prior to Visit  Medication Sig Dispense Refill  . FLUoxetine (PROZAC) 20 MG capsule Take by mouth. 2 tabs daily    . tretinoin (RETIN-A) 0.1 % cream Apply topically at bedtime. 90 g 11  . tretinoin (RETIN-A) 0.1 % cream APPLY TOPICALLY AT BEDTIME. 90 g 10  . Beclomethasone Dipropionate (QNASL) 80 MCG/ACT AERS Place 2 puffs into the nose daily. (Patient not taking: Reported on 04/18/2014) 1 Inhaler 11   No current facility-administered medications on file prior to visit.    No Known Allergies  Family History  Problem Relation Age of Onset  . Adopted: Yes    BP 132/86 mmHg  Pulse 74  Temp(Src) 98.3 F (36.8 C) (Oral)  Ht 5\' 10"  (1.778 m)  Wt 145 lb (65.772 kg)  BMI 20.81 kg/m2  SpO2 99%    Review of Systems She has sensation of decreased airflow through the nasal passages, but no nasal congestion.  Denies  BRBPR.    Objective:   Physical Exam VITAL SIGNS:  See vs page GENERAL: no distress head: no deformity eyes: no periorbital swelling, no proptosis external nose and ears are normal mouth: no lesion seen Both eac's and tm's are normal ABDOMEN: abdomen is soft, nontender.  no hepatosplenomegaly. not distended.  no hernia.    Lab Results  Component Value Date   TSH 6.05* 04/18/2014  UA: pos for blood  Chart review: she has had colonoscopy    Assessment & Plan:  Hypothyroidism: mild exacerbation Pseudohematuria, due to menstruation. abd sxs, new, uncertain etiology Nasal sxs, new, uncertain etiology.     Patient is advised the following: Patient Instructions  blood tests are being requested for you today.  We'll let you know about the results. i have sent a prescription to your pharmacy, for the stomach symptoms. Please see and ear-nose-throat specialist.  you will receive a phone call, about a day and time for an appointment.

## 2014-04-19 ENCOUNTER — Telehealth: Payer: Self-pay | Admitting: Endocrinology

## 2014-04-19 DIAGNOSIS — E039 Hypothyroidism, unspecified: Secondary | ICD-10-CM

## 2014-04-19 NOTE — Telephone Encounter (Signed)
Pt advised of note below and voiced understanding. Pt scheduled for lab work on 05/18/2014 at 815 am.

## 2014-04-19 NOTE — Telephone Encounter (Signed)
Pt advised that we have increased Levothyroxine to 175 mcg due to recent lab work. Pt stated the blood in her urine was due to her menstrual cycle.

## 2014-04-19 NOTE — Telephone Encounter (Signed)
please call patient: Rip HarbourOk, please recheck the thyroid blood test in 1 month.

## 2014-04-19 NOTE — Telephone Encounter (Signed)
Patient is returning your call.  

## 2014-05-18 ENCOUNTER — Other Ambulatory Visit: Payer: Self-pay | Admitting: Endocrinology

## 2014-05-18 ENCOUNTER — Other Ambulatory Visit (INDEPENDENT_AMBULATORY_CARE_PROVIDER_SITE_OTHER): Payer: BLUE CROSS/BLUE SHIELD

## 2014-05-18 DIAGNOSIS — E039 Hypothyroidism, unspecified: Secondary | ICD-10-CM

## 2014-05-18 LAB — TSH: TSH: 7.33 u[IU]/mL — AB (ref 0.35–4.50)

## 2014-05-18 MED ORDER — LEVOTHYROXINE SODIUM 200 MCG PO TABS: 200.0000 ug | ORAL_TABLET | Freq: Every day | ORAL | Status: DC

## 2014-06-01 ENCOUNTER — Telehealth: Payer: Self-pay | Admitting: Endocrinology

## 2014-06-01 MED ORDER — LUBIPROSTONE 24 MCG PO CAPS: 24.0000 ug | ORAL_CAPSULE | Freq: Two times a day (BID) | ORAL | Status: DC

## 2014-06-01 NOTE — Telephone Encounter (Signed)
Called and spoke with pt and pt is aware.  

## 2014-06-01 NOTE — Telephone Encounter (Signed)
please call patient: i got your message.  i have sent a prescription to your pharmacy, for an alternative medication, called "amitiza." i hope this helps.  You may find a discount card on the internet.

## 2014-06-19 ENCOUNTER — Other Ambulatory Visit: Payer: Self-pay

## 2014-06-19 ENCOUNTER — Other Ambulatory Visit (INDEPENDENT_AMBULATORY_CARE_PROVIDER_SITE_OTHER): Payer: BLUE CROSS/BLUE SHIELD

## 2014-06-19 DIAGNOSIS — E039 Hypothyroidism, unspecified: Secondary | ICD-10-CM

## 2014-06-19 LAB — TSH: TSH: 3.35 u[IU]/mL (ref 0.35–4.50)

## 2014-10-10 ENCOUNTER — Encounter (HOSPITAL_COMMUNITY): Payer: Self-pay | Admitting: *Deleted

## 2014-10-15 HISTORY — PX: ABDOMINAL HYSTERECTOMY: SHX81

## 2014-10-19 ENCOUNTER — Ambulatory Visit (HOSPITAL_COMMUNITY)
Admission: RE | Admit: 2014-10-19 | Payer: BLUE CROSS/BLUE SHIELD | Source: Ambulatory Visit | Admitting: Obstetrics and Gynecology

## 2014-10-19 SURGERY — ROBOTIC ASSISTED TOTAL HYSTERECTOMY
Anesthesia: General

## 2014-11-22 ENCOUNTER — Encounter (HOSPITAL_COMMUNITY)
Admission: RE | Admit: 2014-11-22 | Discharge: 2014-11-22 | Disposition: A | Discharge status: Home / Self Care | Payer: BLUE CROSS/BLUE SHIELD | Source: Ambulatory Visit | Attending: Obstetrics and Gynecology | Admitting: Obstetrics and Gynecology

## 2014-11-22 ENCOUNTER — Other Ambulatory Visit: Payer: Self-pay | Admitting: Obstetrics and Gynecology

## 2014-11-22 ENCOUNTER — Encounter (HOSPITAL_COMMUNITY): Payer: Self-pay

## 2014-11-22 DIAGNOSIS — Z01812 Encounter for preprocedural laboratory examination: Secondary | ICD-10-CM | POA: Insufficient documentation

## 2014-11-22 HISTORY — DX: Adverse effect of unspecified anesthetic, initial encounter: T41.45XA

## 2014-11-22 LAB — BASIC METABOLIC PANEL
ANION GAP: 7 (ref 5–15)
BUN: 20 mg/dL (ref 6–20)
CO2: 31 mmol/L (ref 22–32)
Calcium: 9 mg/dL (ref 8.9–10.3)
Chloride: 99 mmol/L — ABNORMAL LOW (ref 101–111)
Creatinine, Ser: 0.9 mg/dL (ref 0.44–1.00)
GFR calc Af Amer: >60 mL/min (ref >60)
GFR calc non Af Amer: >60 mL/min (ref >60)
Glucose, Bld: 92 mg/dL (ref 65–99)
POTASSIUM: 5.4 mmol/L — AB (ref 3.5–5.1)
SODIUM: 137 mmol/L (ref 135–145)

## 2014-11-22 LAB — CBC
HCT: 36.9 % (ref 36.0–46.0)
Hemoglobin: 11.9 g/dL — ABNORMAL LOW (ref 12.0–15.0)
MCH: 31.4 pg (ref 26.0–34.0)
MCHC: 32.2 g/dL (ref 30.0–36.0)
MCV: 97.4 fL (ref 78.0–100.0)
Platelets: 242 10*3/uL (ref 150–400)
RBC: 3.79 MIL/uL — AB (ref 3.87–5.11)
RDW: 14.2 % (ref 11.5–15.5)
WBC: 5.4 10*3/uL (ref 4.0–10.5)

## 2014-11-22 LAB — TYPE AND SCREEN
ABO/RH(D): B POS
Antibody Screen: NEGATIVE

## 2014-11-22 LAB — ABO/RH: ABO/RH(D): B POS

## 2014-11-22 NOTE — Patient Instructions (Signed)
Your procedure is scheduled on:11/29/14  Enter through the Main Entrance at :6:30 am Pick up desk phone and dial [phone] and inform us of your arrival.  Please call [phone] if you have any problems the morning of surgery.  Remember: Do not eat food or drink liquids, including water, after midnight:WED    You may brush your teeth the morning of surgery.  Take these meds the morning of surgery with a sip of water:thyroid pill and Prozac  DO NOT wear jewelry, eye make-up, lipstick,body lotion, or dark fingernail polish.  (Polished toes are ok) You may wear deodorant.  If you are to be admitted after surgery, leave suitcase in car until your room has been assigned. Patients discharged on the day of surgery will not be allowed to drive home. Wear loose fitting, comfortable clothes for your ride home.

## 2014-11-23 ENCOUNTER — Other Ambulatory Visit: Payer: Self-pay

## 2014-11-23 DIAGNOSIS — Z1231 Encounter for screening mammogram for malignant neoplasm of breast: Secondary | ICD-10-CM

## 2014-11-28 NOTE — H&P (Signed)
Alyssa Garner, GRZESIAK NO.:  [id number]  MEDICAL RECORD NO.:  [id number]  LOCATION:  PERIO                         FACILITY:  [facility]  PHYSICIAN:  Lenoard Aden, M.D.DATE OF BIRTH:  02-17-1966  DATE OF ADMISSION:  11/15/2014 DATE OF DISCHARGE:                             HISTORY & PHYSICAL   CHIEF COMPLAINT:  Postmenopausal bleeding with refractory bleeding, normal endometrial biopsy, known uterine anomaly for definitive therapy.  HISTORY OF PRESENT ILLNESS:  The patient is a 49 year old white female G3 with a history of C-section x3 who presents with increased postmenopausal bleeding.  She has had a normal endometrial biopsy And a sonohys consistent with endometrial polyp and uterine septum.  She now presents for definitive therapy.  The patient is not on any endometrial ablation and not a candidate for IV insertion.  She cannot take birth control pills.  MEDICATIONS:  Include Xanax, Prozac, probiotic, Ambien, trazodone, multivitamin, Retin-A, and thyroid replacement therapy.  FAMILY HISTORY:  She has a family history of osteoporosis and diabetes. She is a nonsmoker, nondrinker.  She denies domestic or physical violence.  She has a history of C-section x3.  PHYSICAL EXAMINATION:  GENERAL:  She is a well-developed, well- nourished, white female, in no acute distress. HEENT:  Normal. NECK:  Supple.  Full range of motion. LUNGS:  Clear. HEART:  Regular rate and rhythm. ABDOMEN:  Soft, nontender. PELVIC:  Anteflexed uterus and no adnexal masses. EXTREMITIES:  There are no cords. NEUROLOGIC:  Nonfocal. SKIN:  Intact.  IMPRESSION:  Refractory postmenopausal bleeding with endometrial polyp, uterine septum, uterine anomaly, not a candidate for medical therapy or endometrial ablation or IUD placement.  PLAN:  Plan is to proceed with da Vinci assisted total laparoscopic hysterectomy, bilateral salpingectomy.  Risks of anesthesia, infection, bleeding, uterine  enlargement with possible need for repair was discussed, delayed versus immediate complications to include bowel and bladder injury noted.  The patient acknowledges and wishes to proceed.     Lenoard Aden, M.D.     RJT/MEDQ  D:  11/28/2014  T:  11/28/2014  Job:  [id number]

## 2014-11-29 ENCOUNTER — Ambulatory Visit (HOSPITAL_COMMUNITY): Payer: BLUE CROSS/BLUE SHIELD | Admitting: Anesthesiology

## 2014-11-29 ENCOUNTER — Encounter (HOSPITAL_COMMUNITY): Payer: Self-pay | Admitting: *Deleted

## 2014-11-29 ENCOUNTER — Encounter (HOSPITAL_COMMUNITY)
Admission: RE | Disposition: A | Discharge status: Home / Self Care | Payer: Self-pay | Source: Ambulatory Visit | Attending: Obstetrics and Gynecology

## 2014-11-29 ENCOUNTER — Ambulatory Visit (HOSPITAL_COMMUNITY)
Admission: RE | Admit: 2014-11-29 | Discharge: 2014-11-30 | Disposition: A | Discharge status: Home / Self Care | Payer: BLUE CROSS/BLUE SHIELD | Source: Ambulatory Visit | Attending: Obstetrics and Gynecology | Admitting: Obstetrics and Gynecology

## 2014-11-29 DIAGNOSIS — F419 Anxiety disorder, unspecified: Secondary | ICD-10-CM | POA: Insufficient documentation

## 2014-11-29 DIAGNOSIS — N815 Vaginal enterocele: Secondary | ICD-10-CM | POA: Insufficient documentation

## 2014-11-29 DIAGNOSIS — N950 Postmenopausal bleeding: Secondary | ICD-10-CM | POA: Diagnosis not present

## 2014-11-29 DIAGNOSIS — E039 Hypothyroidism, unspecified: Secondary | ICD-10-CM | POA: Insufficient documentation

## 2014-11-29 DIAGNOSIS — Q512 Other doubling of uterus: Secondary | ICD-10-CM | POA: Insufficient documentation

## 2014-11-29 DIAGNOSIS — N736 Female pelvic peritoneal adhesions (postinfective): Secondary | ICD-10-CM | POA: Insufficient documentation

## 2014-11-29 DIAGNOSIS — Z87891 Personal history of nicotine dependence: Secondary | ICD-10-CM | POA: Insufficient documentation

## 2014-11-29 DIAGNOSIS — M810 Age-related osteoporosis without current pathological fracture: Secondary | ICD-10-CM | POA: Insufficient documentation

## 2014-11-29 DIAGNOSIS — N830 Follicular cyst of ovary: Secondary | ICD-10-CM | POA: Diagnosis not present

## 2014-11-29 DIAGNOSIS — Z90710 Acquired absence of both cervix and uterus: Secondary | ICD-10-CM | POA: Diagnosis not present

## 2014-11-29 DIAGNOSIS — N803 Endometriosis of pelvic peritoneum: Secondary | ICD-10-CM | POA: Diagnosis not present

## 2014-11-29 DIAGNOSIS — K219 Gastro-esophageal reflux disease without esophagitis: Secondary | ICD-10-CM | POA: Insufficient documentation

## 2014-11-29 LAB — CBC
HCT: 38.5 % (ref 36.0–46.0)
HEMOGLOBIN: 12.7 g/dL (ref 12.0–15.0)
MCH: 32.2 pg (ref 26.0–34.0)
MCHC: 33 g/dL (ref 30.0–36.0)
MCV: 97.5 fL (ref 78.0–100.0)
Platelets: 212 10*3/uL (ref 150–400)
RBC: 3.95 MIL/uL (ref 3.87–5.11)
RDW: 13.8 % (ref 11.5–15.5)
WBC: 5.6 10*3/uL (ref 4.0–10.5)

## 2014-11-29 LAB — BASIC METABOLIC PANEL
ANION GAP: 10 (ref 5–15)
BUN: 15 mg/dL (ref 6–20)
CHLORIDE: 99 mmol/L — AB (ref 101–111)
CO2: 29 mmol/L (ref 22–32)
Calcium: 8.5 mg/dL — ABNORMAL LOW (ref 8.9–10.3)
Creatinine, Ser: 0.82 mg/dL (ref 0.44–1.00)
GFR calc non Af Amer: >60 mL/min (ref >60)
Glucose, Bld: 93 mg/dL (ref 65–99)
POTASSIUM: 4.4 mmol/L (ref 3.5–5.1)
Sodium: 138 mmol/L (ref 135–145)

## 2014-11-29 LAB — HCG, SERUM, QUALITATIVE: PREG SERUM: NEGATIVE

## 2014-11-29 SURGERY — ROBOTIC ASSISTED TOTAL HYSTERECTOMY WITH SALPINGECTOMY
Anesthesia: General | Site: Abdomen | Laterality: Bilateral

## 2014-11-29 MED ORDER — GLYCOPYRROLATE 0.2 MG/ML IJ SOLN
INTRAMUSCULAR | Status: AC
  Filled 2014-11-29: qty 3

## 2014-11-29 MED ORDER — MIDAZOLAM HCL 2 MG/2ML IJ SOLN
INTRAMUSCULAR | Status: DC | PRN
  Administered 2014-11-29: 2 mg via INTRAVENOUS

## 2014-11-29 MED ORDER — FLUOXETINE HCL 20 MG PO CAPS
40.0000 mg | ORAL_CAPSULE | Freq: Every day | ORAL | Status: DC
  Administered 2014-11-30: 40 mg via ORAL
  Filled 2014-11-29: qty 2

## 2014-11-29 MED ORDER — METHOCARBAMOL 1000 MG/10ML IJ SOLN
500.0000 mg | Freq: Once | INTRAVENOUS | Status: DC | PRN
  Administered 2014-11-29: 500 mg via INTRAVENOUS
  Filled 2014-11-29 (×2): qty 5

## 2014-11-29 MED ORDER — ARTIFICIAL TEARS OP OINT
TOPICAL_OINTMENT | OPHTHALMIC | Status: DC | PRN
  Administered 2014-11-29: 1 via OPHTHALMIC

## 2014-11-29 MED ORDER — ONDANSETRON HCL 4 MG/2ML IJ SOLN: 4.0000 mg | Freq: Four times a day (QID) | INTRAMUSCULAR | Status: DC | PRN

## 2014-11-29 MED ORDER — NALOXONE HCL 0.4 MG/ML IJ SOLN: 0.4000 mg | INTRAMUSCULAR | Status: DC | PRN

## 2014-11-29 MED ORDER — SCOPOLAMINE 1 MG/3DAYS TD PT72
1.0000 | MEDICATED_PATCH | Freq: Once | TRANSDERMAL | Status: DC
  Administered 2014-11-29: 1.5 mg via TRANSDERMAL

## 2014-11-29 MED ORDER — SODIUM CHLORIDE 0.9 % IJ SOLN
INTRAMUSCULAR | Status: AC
  Filled 2014-11-29: qty 10

## 2014-11-29 MED ORDER — INFLUENZA VAC SPLIT QUAD 0.5 ML IM SUSY: 0.5000 mL | PREFILLED_SYRINGE | INTRAMUSCULAR | Status: DC

## 2014-11-29 MED ORDER — OXYCODONE-ACETAMINOPHEN 5-325 MG PO TABS
1.0000 | ORAL_TABLET | ORAL | Status: DC | PRN
  Administered 2014-11-30: 2 via ORAL
  Administered 2014-11-30 (×2): 1 via ORAL
  Filled 2014-11-29 (×2): qty 2
  Filled 2014-11-29: qty 1

## 2014-11-29 MED ORDER — OXYCODONE HCL 5 MG PO TABS: 5.0000 mg | ORAL_TABLET | Freq: Once | ORAL | Status: DC | PRN

## 2014-11-29 MED ORDER — ROPIVACAINE HCL 5 MG/ML IJ SOLN
INTRAMUSCULAR | Status: AC
  Filled 2014-11-29: qty 60

## 2014-11-29 MED ORDER — METHOCARBAMOL 500 MG PO TABS
500.0000 mg | ORAL_TABLET | Freq: Once | ORAL | Status: DC
  Filled 2014-11-29: qty 1

## 2014-11-29 MED ORDER — LIDOCAINE HCL (CARDIAC) 20 MG/ML IV SOLN
INTRAVENOUS | Status: DC | PRN
  Administered 2014-11-29: 80 mg via INTRAVENOUS

## 2014-11-29 MED ORDER — METHYLENE BLUE 1 % INJ SOLN
INTRAMUSCULAR | Status: AC
  Filled 2014-11-29: qty 1

## 2014-11-29 MED ORDER — PROPOFOL 500 MG/50ML IV EMUL
INTRAVENOUS | Status: AC
  Filled 2014-11-29: qty 50

## 2014-11-29 MED ORDER — DEXAMETHASONE SODIUM PHOSPHATE 10 MG/ML IJ SOLN
INTRAMUSCULAR | Status: AC
  Filled 2014-11-29: qty 1

## 2014-11-29 MED ORDER — DEXTROSE IN LACTATED RINGERS 5 % IV SOLN
INTRAVENOUS | Status: DC
  Administered 2014-11-29 – 2014-11-30 (×2): via INTRAVENOUS

## 2014-11-29 MED ORDER — FENTANYL CITRATE (PF) 250 MCG/5ML IJ SOLN
INTRAMUSCULAR | Status: AC
  Filled 2014-11-29: qty 25

## 2014-11-29 MED ORDER — PROPOFOL INFUSION 10 MG/ML OPTIME
INTRAVENOUS | Status: DC | PRN
  Administered 2014-11-29: 50 ug/kg/min via INTRAVENOUS

## 2014-11-29 MED ORDER — FENTANYL CITRATE (PF) 250 MCG/5ML IJ SOLN
INTRAMUSCULAR | Status: DC | PRN
  Administered 2014-11-29 (×3): 50 ug via INTRAVENOUS
  Administered 2014-11-29: 100 ug via INTRAVENOUS

## 2014-11-29 MED ORDER — HYDROMORPHONE HCL 1 MG/ML IJ SOLN
INTRAMUSCULAR | Status: DC | PRN
  Administered 2014-11-29 (×2): 1 mg via INTRAVENOUS

## 2014-11-29 MED ORDER — ONDANSETRON HCL 4 MG/2ML IJ SOLN
INTRAMUSCULAR | Status: AC
  Filled 2014-11-29: qty 2

## 2014-11-29 MED ORDER — HYDROMORPHONE HCL 1 MG/ML IJ SOLN
INTRAMUSCULAR | Status: AC
  Filled 2014-11-29: qty 1

## 2014-11-29 MED ORDER — ONDANSETRON HCL 4 MG/2ML IJ SOLN
INTRAMUSCULAR | Status: DC | PRN
  Administered 2014-11-29: 4 mg via INTRAVENOUS

## 2014-11-29 MED ORDER — ACETAMINOPHEN 10 MG/ML IV SOLN
1000.0000 mg | Freq: Four times a day (QID) | INTRAVENOUS | Status: DC
  Filled 2014-11-29: qty 100

## 2014-11-29 MED ORDER — NEOSTIGMINE METHYLSULFATE 10 MG/10ML IV SOLN
INTRAVENOUS | Status: DC | PRN
  Administered 2014-11-29: 3 mg via INTRAVENOUS

## 2014-11-29 MED ORDER — DIPHENHYDRAMINE HCL 12.5 MG/5ML PO ELIX
12.5000 mg | ORAL_SOLUTION | Freq: Four times a day (QID) | ORAL | Status: DC | PRN
  Administered 2014-11-29 – 2014-11-30 (×2): 12.5 mg via ORAL
  Filled 2014-11-29 (×2): qty 5

## 2014-11-29 MED ORDER — SODIUM CHLORIDE 0.9 % IJ SOLN
INTRAMUSCULAR | Status: AC
  Filled 2014-11-29: qty 50

## 2014-11-29 MED ORDER — LIDOCAINE HCL (CARDIAC) 20 MG/ML IV SOLN
INTRAVENOUS | Status: AC
  Filled 2014-11-29: qty 5

## 2014-11-29 MED ORDER — DIPHENHYDRAMINE HCL 50 MG/ML IJ SOLN
12.5000 mg | Freq: Four times a day (QID) | INTRAMUSCULAR | Status: DC | PRN
  Administered 2014-11-29: 12.5 mg via INTRAVENOUS
  Filled 2014-11-29: qty 1

## 2014-11-29 MED ORDER — ACETAMINOPHEN 10 MG/ML IV SOLN
1000.0000 mg | Freq: Once | INTRAVENOUS | Status: AC
  Administered 2014-11-29: 1000 mg via INTRAVENOUS
  Filled 2014-11-29: qty 100

## 2014-11-29 MED ORDER — BUPIVACAINE LIPOSOME 1.3 % IJ SUSP
20.0000 mL | Freq: Once | INTRAMUSCULAR | Status: AC
  Administered 2014-11-29: 50 mL
  Filled 2014-11-29: qty 20

## 2014-11-29 MED ORDER — SODIUM CHLORIDE 0.9 % IJ SOLN: 9.0000 mL | INTRAMUSCULAR | Status: DC | PRN

## 2014-11-29 MED ORDER — MIDAZOLAM HCL 2 MG/2ML IJ SOLN
INTRAMUSCULAR | Status: AC
  Filled 2014-11-29: qty 4

## 2014-11-29 MED ORDER — HYDROMORPHONE 0.3 MG/ML IV SOLN
INTRAVENOUS | Status: DC
  Administered 2014-11-29: 1.99 mg via INTRAVENOUS
  Administered 2014-11-29: 13:00:00 via INTRAVENOUS
  Administered 2014-11-30: 1.19 mg via INTRAVENOUS
  Filled 2014-11-29: qty 25

## 2014-11-29 MED ORDER — METHYLENE BLUE 1 % INJ SOLN
INTRAMUSCULAR | Status: DC | PRN
  Administered 2014-11-29: 1 mL

## 2014-11-29 MED ORDER — GLYCOPYRROLATE 0.2 MG/ML IJ SOLN
INTRAMUSCULAR | Status: DC | PRN
  Administered 2014-11-29: 0.6 mg via INTRAVENOUS

## 2014-11-29 MED ORDER — PROPOFOL 10 MG/ML IV BOLUS
INTRAVENOUS | Status: AC
Start: 2014-11-29 — End: 2014-11-29
  Filled 2014-11-29: qty 20

## 2014-11-29 MED ORDER — SODIUM CHLORIDE 0.9 % IJ SOLN
INTRAMUSCULAR | Status: DC | PRN
  Administered 2014-11-29: 10 mL

## 2014-11-29 MED ORDER — OXYCODONE HCL 5 MG/5ML PO SOLN: 5.0000 mg | Freq: Once | ORAL | Status: DC | PRN

## 2014-11-29 MED ORDER — LEVOTHYROXINE SODIUM 200 MCG PO TABS
200.0000 ug | ORAL_TABLET | Freq: Every day | ORAL | Status: DC
  Administered 2014-11-30: 200 ug via ORAL
  Filled 2014-11-29: qty 1

## 2014-11-29 MED ORDER — PROPOFOL 10 MG/ML IV BOLUS
INTRAVENOUS | Status: DC | PRN
  Administered 2014-11-29: 180 mg via INTRAVENOUS

## 2014-11-29 MED ORDER — BUPIVACAINE HCL (PF) 0.25 % IJ SOLN
INTRAMUSCULAR | Status: DC | PRN
  Administered 2014-11-29: 30 mL

## 2014-11-29 MED ORDER — TRAZODONE HCL 50 MG PO TABS
50.0000 mg | ORAL_TABLET | Freq: Every evening | ORAL | Status: DC | PRN
  Administered 2014-11-29: 50 mg via ORAL
  Filled 2014-11-29 (×2): qty 1

## 2014-11-29 MED ORDER — SCOPOLAMINE 1 MG/3DAYS TD PT72
MEDICATED_PATCH | TRANSDERMAL | Status: AC
  Administered 2014-11-29: 1.5 mg via TRANSDERMAL
  Filled 2014-11-29: qty 1

## 2014-11-29 MED ORDER — ALPRAZOLAM 0.5 MG PO TABS
0.5000 mg | ORAL_TABLET | Freq: Every day | ORAL | Status: DC | PRN
  Administered 2014-11-29: 0.5 mg via ORAL
  Filled 2014-11-29: qty 1

## 2014-11-29 MED ORDER — STERILE WATER FOR IRRIGATION IR SOLN
Status: DC | PRN
  Administered 2014-11-29: 250 mL via INTRAVESICAL

## 2014-11-29 MED ORDER — KETOROLAC TROMETHAMINE 30 MG/ML IJ SOLN
INTRAMUSCULAR | Status: AC
  Filled 2014-11-29: qty 1

## 2014-11-29 MED ORDER — ROCURONIUM BROMIDE 100 MG/10ML IV SOLN
INTRAVENOUS | Status: DC | PRN
  Administered 2014-11-29 (×3): 10 mg via INTRAVENOUS
  Administered 2014-11-29: 20 mg via INTRAVENOUS
  Administered 2014-11-29: 35 mg via INTRAVENOUS
  Administered 2014-11-29: 5 mg via INTRAVENOUS

## 2014-11-29 MED ORDER — TRAMADOL HCL 50 MG PO TABS
50.0000 mg | ORAL_TABLET | Freq: Four times a day (QID) | ORAL | Status: DC | PRN
  Administered 2014-11-30 (×2): 50 mg via ORAL
  Filled 2014-11-29 (×2): qty 1

## 2014-11-29 MED ORDER — CEFAZOLIN SODIUM-DEXTROSE 2-3 GM-% IV SOLR: 2.0000 g | INTRAVENOUS | Status: DC

## 2014-11-29 MED ORDER — LACTATED RINGERS IV SOLN
INTRAVENOUS | Status: DC
  Administered 2014-11-29 (×3): via INTRAVENOUS

## 2014-11-29 MED ORDER — HYDROMORPHONE HCL 1 MG/ML IJ SOLN
0.2500 mg | INTRAMUSCULAR | Status: DC | PRN
  Administered 2014-11-29 (×2): 0.5 mg via INTRAVENOUS

## 2014-11-29 MED ORDER — SODIUM CHLORIDE 0.9 % IV SOLN
INTRAVENOUS | Status: DC | PRN
  Administered 2014-11-29: 120 mL

## 2014-11-29 MED ORDER — NEOSTIGMINE METHYLSULFATE 10 MG/10ML IV SOLN
INTRAVENOUS | Status: AC
  Filled 2014-11-29: qty 1

## 2014-11-29 MED ORDER — DEXAMETHASONE SODIUM PHOSPHATE 4 MG/ML IJ SOLN
INTRAMUSCULAR | Status: DC | PRN
  Administered 2014-11-29: 4 mg via INTRAVENOUS

## 2014-11-29 MED ORDER — BUPIVACAINE HCL (PF) 0.25 % IJ SOLN
INTRAMUSCULAR | Status: AC
Start: 2014-11-29 — End: 2014-11-29
  Filled 2014-11-29: qty 30

## 2014-11-29 MED ORDER — KETOROLAC TROMETHAMINE 30 MG/ML IJ SOLN
30.0000 mg | Freq: Once | INTRAMUSCULAR | Status: AC
  Administered 2014-11-29: 30 mg via INTRAVENOUS

## 2014-11-29 MED ORDER — PROMETHAZINE HCL 25 MG/ML IJ SOLN: 6.2500 mg | INTRAMUSCULAR | Status: DC | PRN

## 2014-11-29 MED ORDER — CEFAZOLIN SODIUM-DEXTROSE 2-3 GM-% IV SOLR
INTRAVENOUS | Status: AC
  Administered 2014-11-29: 2 g via INTRAVENOUS
  Filled 2014-11-29: qty 50

## 2014-11-29 SURGICAL SUPPLY — 62 items
CATH FOLEY 3WAY  5CC 16FR (CATHETERS) ×2
CATH FOLEY 3WAY 5CC 16FR (CATHETERS) ×1 IMPLANT
CLOTH BEACON ORANGE TIMEOUT ST (SAFETY) ×3 IMPLANT
CONT PATH 16OZ SNAP LID 3702 (MISCELLANEOUS) ×3 IMPLANT
COVER BACK TABLE 60X90IN (DRAPES) ×6 IMPLANT
COVER TIP SHEARS 8 DVNC (MISCELLANEOUS) ×1 IMPLANT
COVER TIP SHEARS 8MM DA VINCI (MISCELLANEOUS) ×2
DECANTER SPIKE VIAL GLASS SM (MISCELLANEOUS) ×21 IMPLANT
DRAPE WARM FLUID 44X44 (DRAPE) ×3 IMPLANT
DRSG COVADERM PLUS 2X2 (GAUZE/BANDAGES/DRESSINGS) ×12 IMPLANT
DRSG OPSITE POSTOP 3X4 (GAUZE/BANDAGES/DRESSINGS) ×3 IMPLANT
DRSG TEGADERM 4X4.75 (GAUZE/BANDAGES/DRESSINGS) ×3 IMPLANT
DURAPREP 26ML APPLICATOR (WOUND CARE) ×3 IMPLANT
ELECT REM PT RETURN 9FT ADLT (ELECTROSURGICAL) ×3
ELECTRODE REM PT RTRN 9FT ADLT (ELECTROSURGICAL) ×1 IMPLANT
GLOVE BIO SURGEON STRL SZ7.5 (GLOVE) ×30 IMPLANT
GLOVE BIOGEL PI IND STRL 6.5 (GLOVE) ×4 IMPLANT
GLOVE BIOGEL PI IND STRL 7.0 (GLOVE) ×2 IMPLANT
GLOVE BIOGEL PI INDICATOR 6.5 (GLOVE) ×8
GLOVE BIOGEL PI INDICATOR 7.0 (GLOVE) ×4
GLOVE ECLIPSE 6.0 STRL STRAW (GLOVE) ×15 IMPLANT
GLOVE SURG SS PI 7.0 STRL IVOR (GLOVE) ×42 IMPLANT
GOWN STRL REUS W/TWL XL LVL4 (GOWN DISPOSABLE) ×9 IMPLANT
KIT ACCESSORY DA VINCI DISP (KITS) ×2
KIT ACCESSORY DVNC DISP (KITS) ×1 IMPLANT
LEGGING LITHOTOMY PAIR STRL (DRAPES) ×12 IMPLANT
LIQUID BAND (GAUZE/BANDAGES/DRESSINGS) ×3 IMPLANT
NEEDLE INSUFFLATION 150MM (ENDOMECHANICALS) ×3 IMPLANT
OCCLUDER COLPOPNEUMO (BALLOONS) ×3 IMPLANT
PACK ROBOT WH (CUSTOM PROCEDURE TRAY) ×3 IMPLANT
PACK ROBOTIC GOWN (GOWN DISPOSABLE) ×3 IMPLANT
PAD POSITIONING PINK XL (MISCELLANEOUS) ×3 IMPLANT
PAD PREP 24X48 CUFFED NSTRL (MISCELLANEOUS) ×6 IMPLANT
PORT ACCESS TROCAR AIRSEAL 5 (TROCAR) ×3 IMPLANT
PROTECTOR NERVE ULNAR (MISCELLANEOUS) ×3 IMPLANT
SCISSORS LAP 5X35 DISP (ENDOMECHANICALS) ×3 IMPLANT
SET CYSTO W/LG BORE CLAMP LF (SET/KITS/TRAYS/PACK) ×3 IMPLANT
SET IRRIG TUBING LAPAROSCOPIC (IRRIGATION / IRRIGATOR) ×3 IMPLANT
SET TRI-LUMEN FLTR TB AIRSEAL (TUBING) ×3 IMPLANT
SUT VIC AB 0 CT1 27 (SUTURE) ×6
SUT VIC AB 0 CT1 27XBRD ANBCTR (SUTURE) ×2 IMPLANT
SUT VICRYL 0 UR6 27IN ABS (SUTURE) ×3 IMPLANT
SUT VICRYL RAPIDE 4/0 PS 2 (SUTURE) ×6 IMPLANT
SUT VLOC 180 0 9IN  GS21 (SUTURE) ×2
SUT VLOC 180 0 9IN GS21 (SUTURE) ×1 IMPLANT
SYR 3ML LL SCALE MARK (SYRINGE) ×3 IMPLANT
SYR 50ML LL SCALE MARK (SYRINGE) ×3 IMPLANT
SYRINGE 10CC LL (SYRINGE) ×6 IMPLANT
SYRINGE 20CC LL (MISCELLANEOUS) ×3 IMPLANT
SYRINGE 60CC LL (MISCELLANEOUS) ×2 IMPLANT
TIP RUMI ORANGE 6.7MMX12CM (TIP) IMPLANT
TIP UTERINE 5.1X6CM LAV DISP (MISCELLANEOUS) IMPLANT
TIP UTERINE 6.7X6CM WHT DISP (MISCELLANEOUS) IMPLANT
TIP UTERINE 6.7X8CM BLUE DISP (MISCELLANEOUS) ×6 IMPLANT
TOWEL OR 17X24 6PK STRL BLUE (TOWEL DISPOSABLE) ×9 IMPLANT
TROCAR DISP BLADELESS 8 DVNC (TROCAR) ×1 IMPLANT
TROCAR DISP BLADELESS 8MM (TROCAR) ×2
TROCAR PORT AIRSEAL 5X120 (TROCAR) ×3 IMPLANT
TROCAR XCEL 12X100 BLDLESS (ENDOMECHANICALS) ×3 IMPLANT
TROCAR Z-THREAD 12X150 (TROCAR) ×3 IMPLANT
WARMER LAPAROSCOPE (MISCELLANEOUS) ×3 IMPLANT
WATER STERILE IRR 1000ML POUR (IV SOLUTION) ×9 IMPLANT

## 2014-11-29 NOTE — Progress Notes (Signed)
Patient ID: Alyssa Garner, female   DOB: 1965-04-09, 49 y.o.   MRN: [id number] Patient seen and examined. Consent witnessed and signed. No changes noted. Update completed. CBC    Component Value Date/Time   WBC 5.6 11/29/2014 0645   RBC 3.95 11/29/2014 0645   HGB 12.7 11/29/2014 0645   HCT 38.5 11/29/2014 0645   PLT 212 11/29/2014 0645   MCV 97.5 11/29/2014 0645   MCH 32.2 11/29/2014 0645   MCHC 33.0 11/29/2014 0645   RDW 13.8 11/29/2014 0645   LYMPHSABS 1.7 04/18/2014 1412   MONOABS 0.5 04/18/2014 1412   EOSABS 0.1 04/18/2014 1412   BASOSABS 0.0 04/18/2014 1412

## 2014-11-29 NOTE — Anesthesia Preprocedure Evaluation (Addendum)
Anesthesia Evaluation  Patient identified by MRN, date of birth, ID band Patient awake    Reviewed: Allergy & Precautions, H&P , NPO status , Patient's Chart, lab work & pertinent test results  History of Anesthesia Complications (+) history of anesthetic complications (pain and nausea with first C section)  Airway Mallampati: I  TM Distance: >3 FB Neck ROM: full    Dental no notable dental hx.    Pulmonary neg pulmonary ROS, former smoker,    Pulmonary exam normal breath sounds clear to auscultation       Cardiovascular negative cardio ROS Normal cardiovascular exam Rhythm:regular Rate:Normal     Neuro/Psych Anxiety negative neurological ROS     GI/Hepatic Neg liver ROS, GERD  Medicated,  Endo/Other  Hypothyroidism   Renal/GU negative Renal ROS     Musculoskeletal Low back pain with osteoporosis   Abdominal   Peds  Hematology negative hematology ROS (+)   Anesthesia Other Findings   Reproductive/Obstetrics negative OB ROS                            Anesthesia Physical Anesthesia Plan  ASA: II  Anesthesia Plan: General   Post-op Pain Management:    Induction: Intravenous  Airway Management Planned: Oral ETT  Additional Equipment:   Intra-op Plan:   Post-operative Plan: Extubation in OR  Informed Consent: I have reviewed the patients History and Physical, chart, labs and discussed the procedure including the risks, benefits and alternatives for the proposed anesthesia with the patient or authorized representative who has indicated his/her understanding and acceptance.     Plan Discussed with: Anesthesiologist, CRNA and Surgeon  Anesthesia Plan Comments: (GA with ETT, multi therapy anti-emetic therapy with scop patch included, multi-modal analgesia)        Anesthesia Quick Evaluation

## 2014-11-29 NOTE — Transfer of Care (Signed)
Immediate Anesthesia Transfer of Care Note  Patient: Alyssa Garner  Procedure(s) Performed: Procedure(s) with comments: ROBOTIC ASSISTED TOTAL HYSTERECTOMY WITH SALPINGECTOMY (Bilateral) - 2 1/2 hrs.  Patient Location: PACU  Anesthesia Type:General  Level of Consciousness: awake, alert  and oriented  Airway & Oxygen Therapy: Patient Spontanous Breathing and Patient connected to nasal cannula oxygen  Post-op Assessment: Report given to RN and Post -op Vital signs reviewed and stable  Post vital signs: Reviewed and stable  Last Vitals:  Filed Vitals:   11/29/14 0642  BP: 125/86  Pulse: 81  Temp: 36.9 C  Resp: 18    Complications: No apparent anesthesia complications

## 2014-11-29 NOTE — Op Note (Signed)
11/29/2014  10:57 AM  PATIENT:  [name]  49 y.o. female  PRE-OPERATIVE DIAGNOSIS:  PMB, Uterine Anomaly  POST-OPERATIVE DIAGNOSIS:  Same, plus pelvic adhesions, right ovarian cyst, enterocele, endometriosis  PROCEDURE:  Procedure(s): ROBOTIC ASSISTED TOTAL HYSTERECTOMY WITH SALPINGECTOMY LYSIS OF ADHESIONS MCCALL CUL DE PLASTY RIGHT OVARIAN CYSTECTOMY ABLATION OF CUL DE ENDOMETRIOSIS  SURGEON:  Surgeon(s): Olivia Mackie, MD  ASSISTANTS: DAWSON, CNM   ANESTHESIA:   local and general  ESTIMATED BLOOD LOSS: * No blood loss amount entered *   DRAINS: Urinary Catheter (Foley)   LOCAL MEDICATIONS USED:  MARCAINE    and Amount: 30 ml  SPECIMEN:  Source of Specimen:  UTERUS, CYST LINING, BILATERAL TUBAL SEGMENTS, CERVIX  DISPOSITION OF SPECIMEN:  PATHOLOGY  COUNTS:  YES  DICTATION #: [id number]  PLAN OF CARE: DC HOME  PATIENT DISPOSITION:  PACU - hemodynamically stable.

## 2014-11-29 NOTE — Anesthesia Procedure Notes (Signed)
Procedure Name: Intubation Performed by: Karlyne Greenspan Pre-anesthesia Checklist: Patient identified, Emergency Drugs available, Suction available and Patient being monitored Patient Re-evaluated:Patient Re-evaluated prior to inductionOxygen Delivery Method: Circle System Utilized Preoxygenation: Pre-oxygenation with 100% oxygen Intubation Type: IV induction Ventilation: Mask ventilation without difficulty Laryngoscope Size: Mac and 3 Grade View: Grade II Tube type: Oral Tube size: 7.0 mm Number of attempts: 2 Airway Equipment and Method: Stylet and Oral airway Placement Confirmation: ETT inserted through vocal cords under direct vision,  positive ETCO2 and breath sounds checked- equal and bilateral Secured at: 22 cm Tube secured with: Tape Dental Injury: Teeth and Oropharynx as per pre-operative assessment and Injury to lip  Comments: Grade II laryngoscopy view with Mac 3 needing anterior laryngeal pressure, small left upper lip abrasion after intubation attempt, lubrication applied

## 2014-11-29 NOTE — Op Note (Signed)
NAMEALIYANA, DLUGOSZ NO.:  [id number]  MEDICAL RECORD NO.:  [id number]  LOCATION:  9316                          FACILITY:  [facility]  PHYSICIAN:  Lenoard Aden, M.D.DATE OF BIRTH:  02-01-1966  DATE OF PROCEDURE: DATE OF DISCHARGE:                              OPERATIVE REPORT   PREOPERATIVE DIAGNOSES:  Refractory postmenopausal bleeding with known uterine anomaly, previous cesarean section x3.  POSTOPERATIVE DIAGNOSES:  Refractory postmenopausal bleeding with known uterine anomaly, previous cesarean section x3, pelvic adhesions, omental adhesions, right ovarian cyst, Enterocele, pelvic endometriosis.  PROCEDURES:  Da Vinci assisted total laparoscopic hysterectomy, bilateral salpingectomy, lysis of adhesions, ablation of endometriosis, McCall culdoplasty, right ovarian cystectomy.  SURGEON:  Lenoard Aden, M.D.  ASSISTANTMarland Kitchen  Arita Miss.  ESTIMATED BLOOD LOSS:  350 mL.  COMPLICATIONS:  None.  DRAINS:  Foley.  COUNTS:  Correct.  DISPOSITION:  The patient to recovery in good condition.  FINDINGS:  Septate uterus, dense bladder adhesions, cul-de-sac endometriosis, follicular right ovarian cyst, enterocele, and normal left ovary.  DESCRIPTION OF PROCEDURE:  After being apprised of risks of anesthesia, infection, bleeding, injury to surrounding organs, possible need for repair, delayed versus immediate complications to include bowel and bladder injury, possible need for repair, the patient was brought to the operating room where she was administered a general anesthetic without complications.  Prepped and draped in usual sterile fashion.  Foley catheter placed.  RUMI retractor placed per vagina.  Infraumbilical incision was made with a scalpel.  Veress needle placed, opening pressure of -2, 3 L CO2 insufflated without difficulty.  Trocar was placed atraumatically.  Visualization revealed omental adhesions to the anterior abdominal wall.  A misshapen  uterus which is poorly controlled by the RUMI retractor, right ovarian cyst, cul-de-sac endometriosis.  At this time, 2 ports were made on the left and 2 ports on the right.  The assistant port on the left.  All ports were placed atraumatically under direct visualization and the AirSeal was established.  Deep Trendelenburg position was established and the Endo Shears were placed through the assistant port to lyse the omental adhesions from the anterior abdominal wall  using monopolar cautery and sharp dissection. At this time, the PK forceps, ProGrasp, and Endo Shears were placed robotically, and the procedure was initiated, whereby the left ureter was noted.  The left retroperitoneal space was entered distal to the round ligament.  The left ovarian ligament was skeletonized, cauterized with bipolar cautery and cut.  The distal tube after previous tubal separation is lysed along the mesosalpinx and removed.  The ureter was seen peristalsing normally.  The bladder flap was densely adherent.  The bladder was retrograde filled with methylene blue and dissected sharply off the lower uterine segment.  At this time, it was noted that the RUMI retractor was working poorly.  The robot was undocked at this time and the RUMI retractor was replaced without difficulty.  Redocking of the robot was then performed without difficulty and further skeletonization of the left uterine vessels and dissection of the bladder flap are initiated.  The uterine vessels were cauterized and divided on the left. On the right, the distal right tubal segment was removed separately by cauterizing the  mesosalpinx and removed at this time.  The retroperitoneal space was entered in a similar fashion and the ovarian ligament on the right was skeletonized and cut.  The round ligament was divided.  The right uterine vessels were skeletonized by dissecting the broad ligament.  The ureter was peristalsing normally.  The  uterine vessels were cauterized and cut.  At this time, endometriosis in the posterior cul-de-sac was ablated using monopolar cautery and a simple right ovarian cyst was excised using sharp dissection.  Clear fluid noted.  The specimen was then detached to the cervicovaginal junction using monopolar cautery and retracted into the vagina.  The balloon was replaced for vaginal occlusion.  At this time, the cuff was closed using a 0 V-Loc suture in a continuous running fashion and second imbricating layer was placed and McCall culdoplasty sutures placed as well.  Good hemostasis noted.  Irrigation was accomplished.  Good hemostasis was achieved.  Ureters were seen peristalsing bilaterally.  All instruments were removed.  Then, under direct visualization after the robot was undocked, CO2 was released after hemostasis was assured.  The incisions were closed using 0 Vicryl, 4-0 Vicryl, and Dermabond.  Exparel solution was placed.  The patient tolerated the procedure well, was awakened, and transferred to recovery in good condition.     Lenoard Aden, M.D.     RJT/MEDQ  D:  11/29/2014  T:  11/29/2014  Job:  [id number]  cc:   Lenoard Aden, M.D. Fax: [phone]

## 2014-11-29 NOTE — Anesthesia Postprocedure Evaluation (Signed)
  Anesthesia Post-op Note  Patient: Alyssa Garner  Procedure(s) Performed: Procedure(s) (LRB): ROBOTIC ASSISTED TOTAL HYSTERECTOMY WITH SALPINGECTOMY (Bilateral)  Patient Location: PACU  Anesthesia Type: General  Level of Consciousness: awake and alert   Airway and Oxygen Therapy: Patient Spontanous Breathing  Post-op Pain: mild  Post-op Assessment: Post-op Vital signs reviewed, Patient's Cardiovascular Status Stable, Respiratory Function Stable, Patent Airway and No signs of Nausea or vomiting  Last Vitals:  Filed Vitals:   11/29/14 1200  BP: 111/58  Pulse: 79  Temp:   Resp: 10    Post-op Vital Signs: stable   Complications: No apparent anesthesia complications

## 2014-11-30 DIAGNOSIS — N950 Postmenopausal bleeding: Secondary | ICD-10-CM | POA: Diagnosis not present

## 2014-11-30 LAB — CBC
HCT: 28.2 % — ABNORMAL LOW (ref 36.0–46.0)
HEMOGLOBIN: 9.1 g/dL — AB (ref 12.0–15.0)
MCH: 32.2 pg (ref 26.0–34.0)
MCHC: 32.3 g/dL (ref 30.0–36.0)
MCV: 99.6 fL (ref 78.0–100.0)
PLATELETS: 172 10*3/uL (ref 150–400)
RBC: 2.83 MIL/uL — AB (ref 3.87–5.11)
RDW: 14.1 % (ref 11.5–15.5)
WBC: 11.4 10*3/uL — AB (ref 4.0–10.5)

## 2014-11-30 LAB — BASIC METABOLIC PANEL
Anion gap: 6 (ref 5–15)
BUN: 9 mg/dL (ref 6–20)
CHLORIDE: 92 mmol/L — AB (ref 101–111)
CO2: 29 mmol/L (ref 22–32)
Calcium: 7.4 mg/dL — ABNORMAL LOW (ref 8.9–10.3)
Creatinine, Ser: 0.89 mg/dL (ref 0.44–1.00)
Glucose, Bld: 144 mg/dL — ABNORMAL HIGH (ref 65–99)
POTASSIUM: 3.4 mmol/L — AB (ref 3.5–5.1)
SODIUM: 127 mmol/L — AB (ref 135–145)

## 2014-11-30 MED ORDER — TRAMADOL HCL 50 MG PO TABS: 50.0000 mg | ORAL_TABLET | Freq: Four times a day (QID) | ORAL | Status: DC | PRN

## 2014-11-30 MED ORDER — OXYCODONE-ACETAMINOPHEN 5-325 MG PO TABS: 1.0000 | ORAL_TABLET | ORAL | Status: DC | PRN

## 2014-11-30 NOTE — Progress Notes (Signed)
Discharge teaching complete. Pt understood all instructions and did not have any questions. Pt pushed via wheelchair and discharged home to family. 

## 2014-11-30 NOTE — Progress Notes (Signed)
1 Day Post-Op Procedure(s) (LRB): ROBOTIC ASSISTED TOTAL HYSTERECTOMY WITH SALPINGECTOMY (Bilateral)  Subjective: Patient reports nausea, incisional pain, tolerating PO, + flatus and no problems voiding.    Objective:BP 101/61 mmHg  Pulse 69  Temp(Src) 98.1 F (36.7 C) (Oral)  Resp 18  Ht  (1.778 m)  Wt 71.668 kg (158 lb)  BMI 22.67 kg/m2  SpO2 100% I have reviewed patient's vital signs, intake and output, medications and labs.   CBC    Component Value Date/Time   WBC 11.4* 11/30/2014 0530   RBC 2.83* 11/30/2014 0530   HGB 9.1* 11/30/2014 0530   HCT 28.2* 11/30/2014 0530   PLT 172 11/30/2014 0530   MCV 99.6 11/30/2014 0530   MCH 32.2 11/30/2014 0530   MCHC 32.3 11/30/2014 0530   RDW 14.1 11/30/2014 0530   LYMPHSABS 1.7 04/18/2014 1412   MONOABS 0.5 04/18/2014 1412   EOSABS 0.1 04/18/2014 1412   BASOSABS 0.0 04/18/2014 1412      General: alert, cooperative and appears stated age Resp: clear to auscultation bilaterally and normal percussion bilaterally Cardio: regular rate and rhythm, S1, S2 normal, no murmur, click, rub or gallop and normal apical impulse GI: soft, non-tender; bowel sounds normal; no masses,  no organomegaly, normal findings: aorta normal and bowel sounds normal and incision: clean, dry and intact Extremities: extremities normal, atraumatic, no cyanosis or edema and Homans sign is negative, no sign of DVT Vaginal Bleeding: minimal  Assessment: s/p Procedure(s) with comments: ROBOTIC ASSISTED TOTAL HYSTERECTOMY WITH SALPINGECTOMY (Bilateral) - 2 1/2 hrs.: stable, progressing well and tolerating diet  Plan: Advance diet Encourage ambulation Advance to PO medication Discontinue IV fluids Discharge home     TAAVON,RICHARD J 11/30/2014, 7:13 AM

## 2014-12-21 ENCOUNTER — Ambulatory Visit: Payer: BLUE CROSS/BLUE SHIELD

## 2014-12-26 ENCOUNTER — Ambulatory Visit: Payer: BLUE CROSS/BLUE SHIELD

## 2015-03-22 ENCOUNTER — Ambulatory Visit (INDEPENDENT_AMBULATORY_CARE_PROVIDER_SITE_OTHER): Payer: BLUE CROSS/BLUE SHIELD | Admitting: Endocrinology

## 2015-03-22 ENCOUNTER — Encounter: Payer: Self-pay | Admitting: Endocrinology

## 2015-03-22 VITALS — BP 128/82 | HR 88 | Temp 98.6°F | Ht 70.0 in | Wt 162.0 lb

## 2015-03-22 DIAGNOSIS — E039 Hypothyroidism, unspecified: Secondary | ICD-10-CM | POA: Diagnosis not present

## 2015-03-22 MED ORDER — AZITHROMYCIN 500 MG PO TABS
500.0000 mg | ORAL_TABLET | Freq: Every day | ORAL | Status: DC
Start: 2015-03-22 — End: 2015-05-14

## 2015-03-22 MED ORDER — ZOLPIDEM TARTRATE 10 MG PO TABS: 10.0000 mg | ORAL_TABLET | Freq: Every evening | ORAL | Status: DC | PRN

## 2015-03-22 MED ORDER — PROMETHAZINE-CODEINE 6.25-10 MG/5ML PO SYRP: 5.0000 mL | ORAL_SOLUTION | ORAL | Status: DC | PRN

## 2015-03-22 NOTE — Patient Instructions (Signed)
i have sent a prescription to your pharmacy, for the antibiotic pill. Loratadine-d (non-prescription) will help your congestion.  Here are 2 printed prescriptions: cough syrup and ambien.  Please wait until you are doen with the cough syrup before taking ambien, but to an interaction. Call if the ambien helps, so we'll do a longer-term prescription.   Please recheck the thyroid blood test in 3 months.

## 2015-03-22 NOTE — Progress Notes (Signed)
Subjective:    Patient ID: Alyssa Garner, female    DOB: Jul 22, 1965, 50 y.o.   MRN: [id number]  HPI Pt states 2 weeks of moderate prod-quality cough in the chest, and assoc nasal congestion.   She did not tolerate trazodone (nightmares).  Remus Loffler works well, but ins does not cover -CR Past Medical History  Diagnosis Date  . Long-term current use of steroids   . HYPOTHYROIDISM 04/09/2008  . LOW BACK PAIN 12/13/2006  . Anxiety   . Osteoporosis     no treatment  . GERD (gastroesophageal reflux disease)     occ OTC not a problem now  . Complication of anesthesia 1997    pt states with her first CS she had pain and felt bad  . S/P Davinci total laparoscopic hysterectomy with bilateral salpingectomy 11/29/2014    Past Surgical History  Procedure Laterality Date  . Tubal ligation    . Cesarean section      times three  . Breast enhancement surgery      Social History   Social History  . Marital Status: Married    Spouse Name: N/A  . Number of Children: 3  . Years of Education: N/A   Occupational History  .  [employer]   Social History Main Topics  . Smoking status: Former Games developer  . Smokeless tobacco: Never Used  . Alcohol Use: Yes     Comment: socially  . Drug Use: No  . Sexual Activity: Yes    Birth Control/ Protection: Surgical   Other Topics Concern  . Not on file   Social History Narrative    Current Outpatient Prescriptions on File Prior to Visit  Medication Sig Dispense Refill  . FLUoxetine (PROZAC) 40 MG capsule Take 40 mg by mouth daily.    Marland Kitchen levothyroxine (SYNTHROID, LEVOTHROID) 200 MCG tablet Take 200 mcg by mouth daily before breakfast.    . Multiple Vitamin (MULTIVITAMIN WITH MINERALS) TABS tablet Take 1 tablet by mouth daily.    . Probiotic Product (PROBIOTIC DAILY PO) Take 1 capsule by mouth daily.     Marland Kitchen tretinoin (RETIN-A) 0.1 % cream APPLY TOPICALLY AT BEDTIME. 90 g 10   No current facility-administered medications on file prior  to visit.    Allergies  Allergen Reactions  . Trazodone And Nefazodone     nightmares    Family History  Problem Relation Age of Onset  . Adopted: Yes    BP 128/82 mmHg  Pulse 88  Temp(Src) 98.6 F (37 C) (Oral)  Ht 5\' 10"  (1.778 m)  Wt 162 lb (73.483 kg)  BMI 23.24 kg/m2  SpO2 97%    Review of Systems No fever or sob.     Objective:   Physical Exam VITAL SIGNS:  See vs page GENERAL: no distress head: no deformity eyes: no periorbital swelling, no proptosis external nose and ears are normal mouth: no lesion seen Both eac's and tm's are normal NECK: There is no palpable thyroid enlargement.  No thyroid nodule is palpable.  No palpable lymphadenopathy at the anterior neck. LUNGS:  Clear to auscultation.   PSYCH: Alert and well-oriented.  Does not appear anxious nor depressed.   Lab Results  Component Value Date   TSH 3.35 06/19/2014      Assessment & Plan:  URI: new. Hypothyroidism: well-controlled. Insomnia: therapy is limited by multiple drug intolerance.     Patient is advised the following: Patient Instructions  i have sent a prescription to your  pharmacy, for the antibiotic pill. Loratadine-d (non-prescription) will help your congestion.  Here are 2 printed prescriptions: cough syrup and ambien.  Please wait until you are doen with the cough syrup before taking ambien, but to an interaction. Call if the ambien helps, so we'll do a longer-term prescription.   Please recheck the thyroid blood test in 3 months.

## 2015-03-27 ENCOUNTER — Telehealth: Payer: Self-pay | Admitting: Endocrinology

## 2015-03-27 MED ORDER — CEFUROXIME AXETIL 250 MG PO TABS: 250.0000 mg | ORAL_TABLET | Freq: Two times a day (BID) | ORAL | Status: AC

## 2015-03-27 NOTE — Telephone Encounter (Signed)
Please verify no fever or sob i have sent a prescription to your pharmacy

## 2015-03-27 NOTE — Telephone Encounter (Signed)
Patient called stating that she had seen Dr. Everardo AllEllison a week ago and he prescribed antibiotic and she is not still feeling well  She would like to know if he can call her in another round of medication   Please advise    Thank you

## 2015-03-27 NOTE — Telephone Encounter (Signed)
See note below and please advise, Thanks! 

## 2015-03-27 NOTE — Telephone Encounter (Signed)
I contacted the pt and advised of note below. Pt verified she was not having shortness of breath or a fever. Pt advised Rx has been resubmitted.

## 2015-05-07 ENCOUNTER — Telehealth: Payer: Self-pay | Admitting: Endocrinology

## 2015-05-07 NOTE — Telephone Encounter (Signed)
Do we administer vivitrol shots?

## 2015-05-07 NOTE — Telephone Encounter (Signed)
No we don't

## 2015-05-13 ENCOUNTER — Ambulatory Visit: Payer: BLUE CROSS/BLUE SHIELD | Admitting: Endocrinology

## 2015-05-14 ENCOUNTER — Ambulatory Visit (INDEPENDENT_AMBULATORY_CARE_PROVIDER_SITE_OTHER): Payer: BLUE CROSS/BLUE SHIELD | Admitting: Endocrinology

## 2015-05-14 ENCOUNTER — Encounter: Payer: Self-pay | Admitting: Endocrinology

## 2015-05-14 VITALS — BP 122/76 | HR 91 | Temp 99.0°F | Ht 70.0 in | Wt 165.0 lb

## 2015-05-14 DIAGNOSIS — E871 Hypo-osmolality and hyponatremia: Secondary | ICD-10-CM | POA: Diagnosis not present

## 2015-05-14 DIAGNOSIS — Z0000 Encounter for general adult medical examination without abnormal findings: Secondary | ICD-10-CM

## 2015-05-14 DIAGNOSIS — E039 Hypothyroidism, unspecified: Secondary | ICD-10-CM | POA: Diagnosis not present

## 2015-05-14 LAB — BASIC METABOLIC PANEL
BUN: 12 mg/dL (ref 6–23)
CALCIUM: 9.2 mg/dL (ref 8.4–10.5)
CO2: 31 meq/L (ref 19–32)
CREATININE: 0.84 mg/dL (ref 0.40–1.20)
Chloride: 104 mEq/L (ref 96–112)
GFR: 76.69 mL/min (ref >60.00)
Glucose, Bld: 86 mg/dL (ref 70–99)
Potassium: 4.2 mEq/L (ref 3.5–5.1)
Sodium: 139 mEq/L (ref 135–145)

## 2015-05-14 LAB — TSH: TSH: 0.16 u[IU]/mL — ABNORMAL LOW (ref 0.35–4.50)

## 2015-05-14 LAB — LIPID PANEL
CHOLESTEROL: 234 mg/dL — AB (ref 0–200)
HDL: 99 mg/dL (ref >39.00)
LDL Cholesterol: 123 mg/dL — ABNORMAL HIGH (ref 0–99)
NonHDL: 135.24
TRIGLYCERIDES: 60 mg/dL (ref 0.0–149.0)
Total CHOL/HDL Ratio: 2
VLDL: 12 mg/dL (ref 0.0–40.0)

## 2015-05-14 LAB — HEPATIC FUNCTION PANEL
ALBUMIN: 4.2 g/dL (ref 3.5–5.2)
ALK PHOS: 36 U/L — AB (ref 39–117)
ALT: 15 U/L (ref 0–35)
AST: 18 U/L (ref 0–37)
BILIRUBIN DIRECT: 0.1 mg/dL (ref 0.0–0.3)
TOTAL PROTEIN: 7.3 g/dL (ref 6.0–8.3)
Total Bilirubin: 0.4 mg/dL (ref 0.2–1.2)

## 2015-05-14 MED ORDER — ZOLPIDEM TARTRATE 10 MG PO TABS: 10.0000 mg | ORAL_TABLET | Freq: Every evening | ORAL | Status: DC | PRN

## 2015-05-14 MED ORDER — CEPHALEXIN 500 MG PO CAPS: 500.0000 mg | ORAL_CAPSULE | Freq: Three times a day (TID) | ORAL | Status: DC

## 2015-05-14 MED ORDER — LEVOTHYROXINE SODIUM 175 MCG PO TABS: 175.0000 ug | ORAL_TABLET | Freq: Every day | ORAL | Status: DC

## 2015-05-14 MED ORDER — FLUOXETINE HCL 40 MG PO CAPS: 80.0000 mg | ORAL_CAPSULE | Freq: Every day | ORAL | Status: DC

## 2015-05-14 MED ORDER — ALPRAZOLAM 0.5 MG PO TABS
0.5000 mg | ORAL_TABLET | Freq: Three times a day (TID) | ORAL | Status: DC
Start: 2015-05-14 — End: 2015-11-12

## 2015-05-14 NOTE — Patient Instructions (Addendum)
Here are refills of the ambien and xanax. blood tests are requested for you today.  We'll let you know about the results. i have sent a prescription to your pharmacy, for an antibiotic pill.   Please keep the scrape covered with antibiotic ointment and a large bandaid. Please come back for a follow-up appointment in 6 months.

## 2015-05-14 NOTE — Progress Notes (Signed)
   Subjective:    Patient ID: Alyssa Garner, female    DOB: 1965-10-01, 50 y.o.   MRN: [id number]  HPI Pt tripped and fell 1 week ago, scraping right knee.  She has slight pain there since, with assoc redness She was seeing Dr Gaynell Face in [place] (x 18 years), but she wants to start getting prozac and xanax here.  She had severe depression episode as a teenager.  These meds control it and anxiety well.   Past Medical History  Diagnosis Date  . Long-term current use of steroids   . HYPOTHYROIDISM 04/09/2008  . LOW BACK PAIN 12/13/2006  . Anxiety   . Osteoporosis     no treatment  . GERD (gastroesophageal reflux disease)     occ OTC not a problem now  . Complication of anesthesia 1997    pt states with her first CS she had pain and felt bad  . S/P Davinci total laparoscopic hysterectomy with bilateral salpingectomy 11/29/2014    Past Surgical History  Procedure Laterality Date  . Tubal ligation    . Cesarean section      times three  . Breast enhancement surgery      Social History   Social History  . Marital Status: Married    Spouse Name: N/A  . Number of Children: 3  . Years of Education: N/A   Occupational History  .  [employer]   Social History Main Topics  . Smoking status: Former Games developer  . Smokeless tobacco: Never Used  . Alcohol Use: Yes     Comment: socially  . Drug Use: No  . Sexual Activity: Yes    Birth Control/ Protection: Surgical   Other Topics Concern  . Not on file   Social History Narrative    Current Outpatient Prescriptions on File Prior to Visit  Medication Sig Dispense Refill  . Multiple Vitamin (MULTIVITAMIN WITH MINERALS) TABS tablet Take 1 tablet by mouth daily.    . Probiotic Product (PROBIOTIC DAILY PO) Take 1 capsule by mouth daily.     Marland Kitchen tretinoin (RETIN-A) 0.1 % cream APPLY TOPICALLY AT BEDTIME. 90 g 10   No current facility-administered medications on file prior to visit.    Allergies  Allergen Reactions    . Trazodone And Nefazodone     nightmares    Family History  Problem Relation Age of Onset  . Adopted: Yes    BP 122/76 mmHg  Pulse 91  Temp(Src) 99 F (37.2 C) (Oral)  Ht  (1.778 m)  Wt 165 lb (74.844 kg)  BMI 23.68 kg/m2  SpO2 97%  Review of Systems Insomnia is well-controlled.  No SI, drug abuse, or domestic abuse.     Objective:   Physical Exam VITAL SIGNS:  See vs page GENERAL: no distress Right knee: 2x4 cm abrasion, with no drainage, but a 1-cm rim of erythema       Assessment & Plan:  Abrasion, new, with slight cellulitis.  Anxiety, chronic.  Insomnia, chronic.    Patient is advised the following: Patient Instructions  Here are refills of the ambien and xanax. blood tests are requested for you today.  We'll let you know about the results. i have sent a prescription to your pharmacy, for an antibiotic pill.   Please keep the scrape covered with antibiotic ointment and a large bandaid. Please come back for a follow-up appointment in 6 months.

## 2015-05-22 ENCOUNTER — Telehealth: Payer: Self-pay | Admitting: Endocrinology

## 2015-05-22 NOTE — Telephone Encounter (Signed)
Noted  

## 2015-05-22 NOTE — Telephone Encounter (Signed)
Pharmacy called and said disregard the request from earlier

## 2015-05-28 ENCOUNTER — Emergency Department (HOSPITAL_COMMUNITY)
Admission: EM | Admit: 2015-05-28 | Discharge: 2015-05-29 | Disposition: A | Discharge status: Home / Self Care | Payer: BLUE CROSS/BLUE SHIELD | Attending: Emergency Medicine | Admitting: Emergency Medicine

## 2015-05-28 ENCOUNTER — Encounter (HOSPITAL_COMMUNITY): Payer: Self-pay | Admitting: Emergency Medicine

## 2015-05-28 ENCOUNTER — Emergency Department (HOSPITAL_COMMUNITY): Payer: BLUE CROSS/BLUE SHIELD

## 2015-05-28 DIAGNOSIS — S0990XA Unspecified injury of head, initial encounter: Secondary | ICD-10-CM | POA: Diagnosis present

## 2015-05-28 DIAGNOSIS — Z87891 Personal history of nicotine dependence: Secondary | ICD-10-CM | POA: Insufficient documentation

## 2015-05-28 DIAGNOSIS — Z79899 Other long term (current) drug therapy: Secondary | ICD-10-CM | POA: Insufficient documentation

## 2015-05-28 DIAGNOSIS — F419 Anxiety disorder, unspecified: Secondary | ICD-10-CM | POA: Insufficient documentation

## 2015-05-28 DIAGNOSIS — S0101XA Laceration without foreign body of scalp, initial encounter: Secondary | ICD-10-CM | POA: Insufficient documentation

## 2015-05-28 DIAGNOSIS — R4781 Slurred speech: Secondary | ICD-10-CM | POA: Insufficient documentation

## 2015-05-28 DIAGNOSIS — Y9289 Other specified places as the place of occurrence of the external cause: Secondary | ICD-10-CM | POA: Diagnosis not present

## 2015-05-28 DIAGNOSIS — Y998 Other external cause status: Secondary | ICD-10-CM | POA: Insufficient documentation

## 2015-05-28 DIAGNOSIS — Z8739 Personal history of other diseases of the musculoskeletal system and connective tissue: Secondary | ICD-10-CM | POA: Insufficient documentation

## 2015-05-28 DIAGNOSIS — Z90710 Acquired absence of both cervix and uterus: Secondary | ICD-10-CM | POA: Insufficient documentation

## 2015-05-28 DIAGNOSIS — E039 Hypothyroidism, unspecified: Secondary | ICD-10-CM | POA: Diagnosis not present

## 2015-05-28 DIAGNOSIS — F10120 Alcohol abuse with intoxication, uncomplicated: Secondary | ICD-10-CM | POA: Diagnosis not present

## 2015-05-28 DIAGNOSIS — W01198A Fall on same level from slipping, tripping and stumbling with subsequent striking against other object, initial encounter: Secondary | ICD-10-CM | POA: Diagnosis not present

## 2015-05-28 DIAGNOSIS — Z8719 Personal history of other diseases of the digestive system: Secondary | ICD-10-CM | POA: Insufficient documentation

## 2015-05-28 DIAGNOSIS — Y9389 Activity, other specified: Secondary | ICD-10-CM | POA: Diagnosis not present

## 2015-05-28 DIAGNOSIS — Z792 Long term (current) use of antibiotics: Secondary | ICD-10-CM | POA: Diagnosis not present

## 2015-05-28 DIAGNOSIS — F10920 Alcohol use, unspecified with intoxication, uncomplicated: Secondary | ICD-10-CM

## 2015-05-28 HISTORY — DX: Alcohol abuse, uncomplicated: F10.10

## 2015-05-28 NOTE — ED Notes (Signed)
PT fell at a restaurant and struck her head on a corner of furniture. PT has a lac to back of scalp. PT is having difficulty speaking. PT had a few drink and took a "few pills" per her friends. PT denies taking any meds.

## 2015-05-28 NOTE — ED Notes (Signed)
Pt removed her neck brace after this RN has informed her that she need to have it on.

## 2015-05-28 NOTE — ED Provider Notes (Signed)
CSN: [id number]     Arrival date & time 05/28/15  2042 History   First MD Initiated Contact with Patient 05/28/15 2050     Chief Complaint  Patient presents with  . Head Injury     (Consider location/radiation/quality/duration/timing/severity/associated sxs/prior Treatment) HPI Comments: Patient presents after a fall. She states that she drink too much. She doesn't remember what happened but per EMS report, she fell backward and struck her head on the corner of a piece of furniture. It's unknown whether she had a loss of consciousness. Patient's history is limited in that she is not answering questions fully. However she states that she drink 2 glasses of wine. She denies any other ingestions. She denies any other injury from the fall.  Patient is a 50 y.o. female presenting with head injury.  Head Injury   Past Medical History  Diagnosis Date  . Long-term current use of steroids   . HYPOTHYROIDISM 04/09/2008  . LOW BACK PAIN 12/13/2006  . Anxiety   . Osteoporosis     no treatment  . GERD (gastroesophageal reflux disease)     occ OTC not a problem now  . Complication of anesthesia 1997    pt states with her first CS she had pain and felt bad  . S/P Davinci total laparoscopic hysterectomy with bilateral salpingectomy 11/29/2014   Past Surgical History  Procedure Laterality Date  . Tubal ligation    . Cesarean section      times three  . Breast enhancement surgery     Family History  Problem Relation Age of Onset  . Adopted: Yes   Social History  Substance Use Topics  . Smoking status: Former Games developer  . Smokeless tobacco: Never Used  . Alcohol Use: Yes     Comment: socially   OB History    No data available     Review of Systems  Unable to perform ROS: Mental status change      Allergies  Trazodone and nefazodone  Home Medications   Prior to Admission medications   Medication Sig Start Date End Date Taking? Authorizing Provider  ALPRAZolam Prudy Feeler) 0.5 MG  tablet Take 1 tablet (0.5 mg total) by mouth 3 (three) times daily. 05/14/15  Yes Romero Belling, MD  FLUoxetine (PROZAC) 40 MG capsule Take 2 capsules (80 mg total) by mouth daily. 05/14/15  Yes Romero Belling, MD  levothyroxine (SYNTHROID, LEVOTHROID) 175 MCG tablet Take 1 tablet (175 mcg total) by mouth daily before breakfast. 05/14/15  Yes Romero Belling, MD  Multiple Vitamin (MULTIVITAMIN WITH MINERALS) TABS tablet Take 1 tablet by mouth daily.   Yes Historical Provider, MD  Probiotic Product (PROBIOTIC DAILY PO) Take 1 capsule by mouth daily.    Yes Historical Provider, MD  tretinoin (RETIN-A) 0.1 % cream APPLY TOPICALLY AT BEDTIME. 06/09/13  Yes Romero Belling, MD  zolpidem (AMBIEN) 10 MG tablet Take 1 tablet (10 mg total) by mouth at bedtime as needed for sleep. 05/14/15 06/13/15 Yes Romero Belling, MD  cephALEXin (KEFLEX) 500 MG capsule Take 1 capsule (500 mg total) by mouth 3 (three) times daily. 05/14/15   Romero Belling, MD   BP 104/72 mmHg  Pulse 74  Temp(Src) 97.9 F (36.6 C) (Oral)  Resp 16  Ht  (1.778 m)  Wt 165 lb (74.844 kg)  BMI 23.68 kg/m2  SpO2 96% Physical Exam  Constitutional: She appears well-developed and well-nourished.  HENT:  Head: Normocephalic.  There is a laceration to the right parietal area. No active  bleeding  Eyes: Pupils are equal, round, and reactive to light.  Neck:  C-collar is in place. There is no palpable tenderness to the cervical thoracic or lumbosacral spine  Cardiovascular: Normal rate, regular rhythm and normal heart sounds.   Pulmonary/Chest: Effort normal and breath sounds normal. No respiratory distress. She has no wheezes. She has no rales. She exhibits no tenderness.  Abdominal: Soft. Bowel sounds are normal. There is no tenderness. There is no rebound and no guarding.  Musculoskeletal: Normal range of motion. She exhibits no edema.  No pain on palpation or range of motion extremities  Lymphadenopathy:    She has no cervical adenopathy.   Neurological: She is alert.  Patient has some slurred speech. She tells me her name but she cannot timing to date. She moves all extremities symmetrically without focal deficits. There is no facial drooping.  Skin: Skin is warm and dry. No rash noted.  Psychiatric: She has a normal mood and affect.    ED Course  Procedures (including critical care time) Labs Review Labs Reviewed - No data to display  Imaging Review Ct Head Wo Contrast  05/28/2015  CLINICAL DATA:  Status post fall in restaurant. Struck head on corner of furniture, with posterior scalp laceration. Difficulty speaking. Concern for cervical spine injury. Initial encounter. EXAM: CT HEAD WITHOUT CONTRAST CT CERVICAL SPINE WITHOUT CONTRAST TECHNIQUE: Multidetector CT imaging of the head and cervical spine was performed following the standard protocol without intravenous contrast. Multiplanar CT image reconstructions of the cervical spine were also generated. COMPARISON:  None. FINDINGS: CT HEAD FINDINGS There is no evidence of acute infarction, mass lesion, or intra- or extra-axial hemorrhage on CT. Prominence of the sulci suggests mild cortical volume loss. The brainstem and fourth ventricle are within normal limits. The basal ganglia are unremarkable in appearance. The cerebral hemispheres demonstrate grossly normal gray-white differentiation. No mass effect or midline shift is seen. There is no evidence of fracture; visualized osseous structures are unremarkable in appearance. The orbits are within normal limits. The paranasal sinuses and mastoid air cells are well-aerated. Soft tissue swelling is noted overlying the right posterior parietal calvarium. CT CERVICAL SPINE FINDINGS There is no evidence of fracture or subluxation. Vertebral bodies demonstrate normal height and alignment. Intervertebral disc spaces are preserved. Prevertebral soft tissues are within normal limits. The visualized neural foramina are grossly unremarkable. The  thyroid gland is unremarkable in appearance. The visualized lung apices are clear. No significant soft tissue abnormalities are seen. IMPRESSION: 1. No evidence of traumatic intracranial injury or fracture. 2. No evidence of fracture or subluxation along the cervical spine. 3. Soft tissue swelling overlying the right posterior parietal calvarium. 4. Mild cortical volume loss suggested. Electronically Signed   By: Roanna RaiderJeffery  Chang M.D.   On: 05/28/2015 23:44   Ct Cervical Spine Wo Contrast  05/28/2015  CLINICAL DATA:  Status post fall in restaurant. Struck head on corner of furniture, with posterior scalp laceration. Difficulty speaking. Concern for cervical spine injury. Initial encounter. EXAM: CT HEAD WITHOUT CONTRAST CT CERVICAL SPINE WITHOUT CONTRAST TECHNIQUE: Multidetector CT imaging of the head and cervical spine was performed following the standard protocol without intravenous contrast. Multiplanar CT image reconstructions of the cervical spine were also generated. COMPARISON:  None. FINDINGS: CT HEAD FINDINGS There is no evidence of acute infarction, mass lesion, or intra- or extra-axial hemorrhage on CT. Prominence of the sulci suggests mild cortical volume loss. The brainstem and fourth ventricle are within normal limits. The basal ganglia are unremarkable  in appearance. The cerebral hemispheres demonstrate grossly normal gray-white differentiation. No mass effect or midline shift is seen. There is no evidence of fracture; visualized osseous structures are unremarkable in appearance. The orbits are within normal limits. The paranasal sinuses and mastoid air cells are well-aerated. Soft tissue swelling is noted overlying the right posterior parietal calvarium. CT CERVICAL SPINE FINDINGS There is no evidence of fracture or subluxation. Vertebral bodies demonstrate normal height and alignment. Intervertebral disc spaces are preserved. Prevertebral soft tissues are within normal limits. The visualized neural  foramina are grossly unremarkable. The thyroid gland is unremarkable in appearance. The visualized lung apices are clear. No significant soft tissue abnormalities are seen. IMPRESSION: 1. No evidence of traumatic intracranial injury or fracture. 2. No evidence of fracture or subluxation along the cervical spine. 3. Soft tissue swelling overlying the right posterior parietal calvarium. 4. Mild cortical volume loss suggested. Electronically Signed   By: Roanna Raider M.D.   On: 05/28/2015 23:44   I have personally reviewed and evaluated these images and lab results as part of my medical decision-making.   EKG Interpretation None      MDM   Final diagnoses:  Head injury, initial encounter  Alcohol intoxication, uncomplicated [facility])    Patient had CT scans of the head and cervical spine. These are unremarkable. There is no evidence of intracranial hemorrhage or cervical spine injury. She denies other injuries or complaints other than a headache.  She has had no vomiting.  She is much more alert now. She is talking normally. She is alert and oriented 3. She is able to ambulate with a steady gait. She was discharged home in good condition. She is taking [employer] home.  I did clean the wound to her scalp. I don't see any significant lacerations that need suturing. It appears to be more of an abrasion.She states her tetanus shot is up-to-date.    Rolan Bucco, MD 05/29/15 [phone]

## 2015-05-28 NOTE — ED Notes (Signed)
Patient transported to CT 

## 2015-05-29 ENCOUNTER — Encounter (HOSPITAL_COMMUNITY): Payer: Self-pay | Admitting: Emergency Medicine

## 2015-05-29 MED ORDER — ACETAMINOPHEN 500 MG PO TABS: 1000.0000 mg | ORAL_TABLET | Freq: Once | ORAL | Status: DC

## 2015-05-29 NOTE — ED Notes (Signed)
Pt ambulated with Belfi MD without any difficulty. Pt also refused tylenol. Pt requesting to know which restaurant she was picked up at. Informed her I was not sure. Pt also requesting that I call [employer] for her b/c her phone is dead. Informed her that I could take her to a charging station in the waiting room so she can charge her phone then contact ub[employer] or we could arrange a cab to pick her up. Placed pt at charging station and pt stood up and walked out of ED. NAD noted

## 2015-05-29 NOTE — Discharge Instructions (Signed)
Alcohol Intoxication °Alcohol intoxication occurs when the amount of alcohol that a person has consumed impairs his or her ability to mentally and physically function. Alcohol directly impairs the normal chemical activity of the brain. Drinking large amounts of alcohol can lead to changes in mental function and behavior, and it can cause many physical effects that can be harmful.  °Alcohol intoxication can range in severity from mild to very severe. Various factors can affect the level of intoxication that occurs, such as the person's age, gender, weight, frequency of alcohol consumption, and the presence of other medical conditions (such as diabetes, seizures, or heart conditions). Dangerous levels of alcohol intoxication may occur when people drink large amounts of alcohol in a short period (binge drinking). Alcohol can also be especially dangerous when combined with certain prescription medicines or "recreational" drugs. °SIGNS AND SYMPTOMS °Some common signs and symptoms of mild alcohol intoxication include: °· Loss of coordination. °· Changes in mood and behavior. °· Impaired judgment. °· Slurred speech. °As alcohol intoxication progresses to more severe levels, other signs and symptoms will appear. These may include: °· Vomiting. °· Confusion and impaired memory. °· Slowed breathing. °· Seizures. °· Loss of consciousness. °DIAGNOSIS  °Your health care provider will take a medical history and perform a physical exam. You will be asked about the amount and type of alcohol you have consumed. Blood tests will be done to measure the concentration of alcohol in your blood. In many places, your blood alcohol level must be lower than 80 mg/dL (0.08%) to legally drive. However, many dangerous effects of alcohol can occur at much lower levels.  °TREATMENT  °People with alcohol intoxication often do not require treatment. Most of the effects of alcohol intoxication are temporary, and they go away as the alcohol naturally  leaves the body. Your health care provider will monitor your condition until you are stable enough to go home. Fluids are sometimes given through an IV access tube to help prevent dehydration.  °HOME CARE INSTRUCTIONS °· Do not drive after drinking alcohol. °· Stay hydrated. Drink enough water and fluids to keep your urine clear or pale yellow. Avoid caffeine.   °· Only take over-the-counter or prescription medicines as directed by your health care provider.   °SEEK MEDICAL CARE IF:  °· You have persistent vomiting.   °· You do not feel better after a few days. °· You have frequent alcohol intoxication. Your health care provider can help determine if you should see a substance use treatment counselor. °SEEK IMMEDIATE MEDICAL CARE IF:  °· You become shaky or tremble when you try to stop drinking.   °· You shake uncontrollably (seizure).   °· You throw up (vomit) blood. This may be bright red or may look like black coffee grounds.   °· You have blood in your stool. This may be bright red or may appear as a black, tarry, bad smelling stool.   °· You become lightheaded or faint.   °MAKE SURE YOU:  °· Understand these instructions. °· Will watch your condition. °· Will get help right away if you are not doing well or get worse. °  °This information is not intended to replace advice given to you by your health care provider. Make sure you discuss any questions you have with your health care provider. °  °Document Released: 12/10/2004 Document Revised: 11/02/2012 Document Reviewed: 08/05/2012 °Elsevier Interactive Patient Education ©2016 [employer] ° °Head Injury, Adult °You have a head injury. Headaches and throwing up (vomiting) are common after a head injury. It should   be easy to wake up from sleeping. Sometimes you must stay in the hospital. Most problems happen within the first 24 hours. Side effects may occur up to 7-10 days after the injury.  °WHAT ARE THE TYPES OF HEAD INJURIES? °Head injuries can be as minor as  a bump. Some head injuries can be more severe. More severe head injuries include: °· A jarring injury to the brain (concussion). °· A bruise of the brain (contusion). This mean there is bleeding in the brain that can cause swelling. °· A cracked skull (skull fracture). °· Bleeding in the brain that collects, clots, and forms a bump (hematoma). °WHEN SHOULD I GET HELP RIGHT AWAY?  °· You are confused or sleepy. °· You cannot be woken up. °· You feel sick to your stomach (nauseous) or keep throwing up (vomiting). °· Your dizziness or unsteadiness is getting worse. °· You have very bad, lasting headaches that are not helped by medicine. Take medicines only as told by your doctor. °· You cannot use your arms or legs like normal. °· You cannot walk. °· You notice changes in the black spots in the center of the colored part of your eye (pupil). °· You have clear or bloody fluid coming from your nose or ears. °· You have trouble seeing. °During the next 24 hours after the injury, you must stay with someone who can watch you. This person should get help right away (call 911 in the [place] if you start to shake and are not able to control it (have seizures), you pass out, or you are unable to wake up. °HOW CAN I PREVENT A HEAD INJURY IN THE FUTURE? °· Wear seat belts. °· Wear a helmet while bike riding and playing sports like football. °· Stay away from dangerous activities around the house. °WHEN CAN I RETURN TO NORMAL ACTIVITIES AND ATHLETICS? °See your doctor before doing these activities. You should not do normal activities or play contact sports until 1 week after the following symptoms have stopped: °· Headache that does not go away. °· Dizziness. °· Poor attention. °· Confusion. °· Memory problems. °· Sickness to your stomach or throwing up. °· Tiredness. °· Fussiness. °· Bothered by bright lights or loud noises. °· Anxiousness or depression. °· Restless sleep. °MAKE SURE YOU:  °· Understand these instructions. °· Will  watch your condition. °· Will get help right away if you are not doing well or get worse. °  °This information is not intended to replace advice given to you by your health care provider. Make sure you discuss any questions you have with your health care provider. °  °Document Released: 02/13/2008 Document Revised: 03/23/2014 Document Reviewed: 11/07/2012 °Elsevier Interactive Patient Education ©2016 Elsevier Inc. ° °

## 2015-06-19 ENCOUNTER — Encounter (HOSPITAL_COMMUNITY): Payer: Self-pay

## 2015-06-19 ENCOUNTER — Emergency Department (HOSPITAL_COMMUNITY)
Admission: EM | Admit: 2015-06-19 | Discharge: 2015-06-19 | Disposition: A | Discharge status: Home / Self Care | Payer: BLUE CROSS/BLUE SHIELD | Attending: Emergency Medicine | Admitting: Emergency Medicine

## 2015-06-19 DIAGNOSIS — Y9301 Activity, walking, marching and hiking: Secondary | ICD-10-CM | POA: Insufficient documentation

## 2015-06-19 DIAGNOSIS — F419 Anxiety disorder, unspecified: Secondary | ICD-10-CM | POA: Insufficient documentation

## 2015-06-19 DIAGNOSIS — S3992XA Unspecified injury of lower back, initial encounter: Secondary | ICD-10-CM | POA: Diagnosis present

## 2015-06-19 DIAGNOSIS — W010XXA Fall on same level from slipping, tripping and stumbling without subsequent striking against object, initial encounter: Secondary | ICD-10-CM | POA: Diagnosis not present

## 2015-06-19 DIAGNOSIS — Z79899 Other long term (current) drug therapy: Secondary | ICD-10-CM | POA: Insufficient documentation

## 2015-06-19 DIAGNOSIS — Y998 Other external cause status: Secondary | ICD-10-CM | POA: Insufficient documentation

## 2015-06-19 DIAGNOSIS — Z7952 Long term (current) use of systemic steroids: Secondary | ICD-10-CM | POA: Insufficient documentation

## 2015-06-19 DIAGNOSIS — Z8739 Personal history of other diseases of the musculoskeletal system and connective tissue: Secondary | ICD-10-CM | POA: Diagnosis not present

## 2015-06-19 DIAGNOSIS — E039 Hypothyroidism, unspecified: Secondary | ICD-10-CM | POA: Insufficient documentation

## 2015-06-19 DIAGNOSIS — Z87891 Personal history of nicotine dependence: Secondary | ICD-10-CM | POA: Insufficient documentation

## 2015-06-19 DIAGNOSIS — Y9289 Other specified places as the place of occurrence of the external cause: Secondary | ICD-10-CM | POA: Diagnosis not present

## 2015-06-19 DIAGNOSIS — Z8719 Personal history of other diseases of the digestive system: Secondary | ICD-10-CM | POA: Insufficient documentation

## 2015-06-19 DIAGNOSIS — M545 Low back pain, unspecified: Secondary | ICD-10-CM

## 2015-06-19 DIAGNOSIS — Z792 Long term (current) use of antibiotics: Secondary | ICD-10-CM | POA: Diagnosis not present

## 2015-06-19 MED ORDER — IBUPROFEN 600 MG PO TABS: 600.0000 mg | ORAL_TABLET | Freq: Four times a day (QID) | ORAL | Status: DC | PRN

## 2015-06-19 MED ORDER — HYDROCODONE-ACETAMINOPHEN 5-325 MG PO TABS: 2.0000 | ORAL_TABLET | ORAL | Status: DC | PRN

## 2015-06-19 MED ORDER — CYCLOBENZAPRINE HCL 10 MG PO TABS: 10.0000 mg | ORAL_TABLET | Freq: Two times a day (BID) | ORAL | Status: DC | PRN

## 2015-06-19 MED ORDER — MORPHINE SULFATE (PF) 4 MG/ML IV SOLN
4.0000 mg | Freq: Once | INTRAVENOUS | Status: AC
  Administered 2015-06-19: 4 mg via INTRAVENOUS
  Filled 2015-06-19: qty 1

## 2015-06-19 MED ORDER — HYDROCODONE-ACETAMINOPHEN 5-325 MG PO TABS
2.0000 | ORAL_TABLET | ORAL | Status: DC | PRN
Start: 2015-06-19 — End: 2015-06-19

## 2015-06-19 MED ORDER — DIAZEPAM 5 MG/ML IJ SOLN
5.0000 mg | Freq: Once | INTRAMUSCULAR | Status: AC
  Administered 2015-06-19: 5 mg via INTRAVENOUS
  Filled 2015-06-19: qty 2

## 2015-06-19 NOTE — ED Provider Notes (Signed)
CSN: [id number]     Arrival date & time 06/19/15  1420 History   First MD Initiated Contact with Patient 06/19/15 1506     Chief Complaint  Patient presents with  . Back Pain   HPI Comments: 50 year old female presents with acute onset of low back pain after a fall. She states she was walking up a step last night and stumbled due to wearing heels. She feel backwards and landed directly on to her back. Denies LOC, head trauma, numbness, tingling, bowel/bladder incontinence. 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 She went to an Ortho office today but was not actually seen by them as she was in too much pain. She was brought in by EMS today. She is able to ambulate however pain is worse with movement. Given Toradol, Phenergan, and Fentanyl by EMS with minimal relief. Currently asking for more pain medicine and a urinary catheter.  Patient is a 50 y.o. female presenting with back pain.  Back Pain Associated symptoms: no numbness and no weakness     Past Medical History  Diagnosis Date  . Long-term current use of steroids   . HYPOTHYROIDISM 04/09/2008  . LOW BACK PAIN 12/13/2006  . Anxiety   . Osteoporosis     no treatment  . GERD (gastroesophageal reflux disease)     occ OTC not a problem now  . Complication of anesthesia 1997    pt states with her first CS she had pain and felt bad  . S/P Davinci total laparoscopic hysterectomy with bilateral salpingectomy 11/29/2014  . ETOH abuse    Past Surgical History  Procedure Laterality Date  . Tubal ligation    . Cesarean section      times three  . Breast enhancement surgery     Family History  Problem Relation Age of Onset  . Adopted: Yes   Social History  Substance Use Topics  . Smoking status: Former Games developermoker  . Smokeless tobacco: Never Used  . Alcohol Use: Yes     Comment: socially   OB History    No data available     Review of Systems  Musculoskeletal: Positive for back pain and gait  problem.  Neurological: Negative for dizziness, syncope, weakness and numbness.    Allergies  Trazodone and nefazodone  Home Medications   Prior to Admission medications   Medication Sig Start Date End Date Taking? Authorizing Provider  ALPRAZolam Prudy Feeler(XANAX) 0.5 MG tablet Take 1 tablet (0.5 mg total) by mouth 3 (three) times daily. 05/14/15  Yes Romero BellingSean Ellison, MD  bisacodyl (DULCOLAX) 5 MG EC tablet Take 10 mg by mouth daily as needed for moderate constipation.   Yes Historical Provider, MD  chlordiazePOXIDE (LIBRIUM) 10 MG capsule Take 10 mg by mouth 2 (two) times daily. 06/06/15  Yes Historical Provider, MD  FLUoxetine (PROZAC) 40 MG capsule Take 2 capsules (80 mg total) by mouth daily. 05/14/15  Yes Romero BellingSean Ellison, MD  ibuprofen (ADVIL,MOTRIN) 200 MG tablet Take 800 mg by mouth every 6 (six) hours as needed for moderate pain.   Yes Historical Provider, MD  levothyroxine (SYNTHROID, LEVOTHROID) 175 MCG tablet Take 1 tablet (175 mcg total) by mouth daily before breakfast. 05/14/15  Yes Romero BellingSean Ellison, MD  Melatonin 5 MG TABS Take 5 mg by mouth at bedtime as needed (sleep).   Yes Historical Provider, MD  Multiple Vitamin (MULTIVITAMIN WITH MINERALS) TABS tablet Take 1 tablet by mouth daily.   Yes Historical  Provider, MD  Probiotic Product (PROBIOTIC DAILY PO) Take 1 capsule by mouth daily.    Yes Historical Provider, MD  Simethicone (GAS-X PO) Take 2 tablets by mouth daily as needed (gas).   Yes Historical Provider, MD  tretinoin (RETIN-A) 0.1 % cream APPLY TOPICALLY AT BEDTIME. Patient taking differently: APPLY TOPICALLY AT BEDTIME AS NEEDED 06/09/13  Yes Romero Belling, MD  zolpidem (AMBIEN) 10 MG tablet Take 1 tablet (10 mg total) by mouth at bedtime as needed for sleep. 05/14/15 06/19/15 Yes Romero Belling, MD  cephALEXin (KEFLEX) 500 MG capsule Take 1 capsule (500 mg total) by mouth 3 (three) times daily. 05/14/15   Romero Belling, MD   BP 129/95 mmHg  Pulse 66  Temp(Src) 98 F (36.7 C) (Oral)  Resp 18   SpO2 98%   Physical Exam  Constitutional: She is oriented to person, place, and time. She appears well-developed and well-nourished. No distress.  HENT:  Head: Normocephalic and atraumatic.  Eyes: Pupils are equal, round, and reactive to light.  Pulmonary/Chest: Effort normal.  Neurological: She is alert and oriented to person, place, and time.  Back: Inspection: No masses, deformity Palpation: Tenderness to palpation of lumbosacral spine and diffuse tenderness of paraspinal muscles, R>L ROM: Decreased flexion, extension, lateral rotation and flexion of back.  Gait: Antalgic gait Reflexes: Patellar reflex is 2+ bilaterally, Achilles is 2+ bilaterally SLR: Negative seated straight leg raise  Skin: Skin is warm and dry.  Psychiatric: She has a normal mood and affect.    ED Course  Procedures (including critical care time)  Pt requesting catheter because she needs to pee. Advised that this is not generally done due to risk of infection. Pt verbalizes understanding.  MDM   Final diagnoses:  Bilateral low back pain without sciatica   50 year old female with acute low back pain due to mechanical fall. She was has received Morphine and Valium here in the ED with a good amount of relief, however still asking for more pain medicine. She has no neuro deficits, no loss of bowel/bladder function, saddle anesthesia, paresthesias, negative SLR. Her tenderness is mostly muscular, no imaging indicated at this time. Pt given rx for Norco, Ibuprofen, and Flexaril. Advised to follow up with Ortho. Work note given. Patient informed of clinical course, understand medical decision-making process, and agree with plan.    Bethel Born, PA-C 06/19/15 1744  Marily Memos, MD 06/20/15 [phone]

## 2015-06-19 NOTE — Discharge Instructions (Signed)

## 2015-06-19 NOTE — ED Notes (Signed)
Per EMS, pt has degenerative disc chronic.  Pt from [facility] office.  Pt pain needs further evaluation.  Pt fell last night in heels and tripped on concrete.  Landed on back side.  No LOC or head injury.  Pt medicated 60 mg IM toradol and 12.5 phenergan.  In EMS route 20g RAC, fentanyl given.  Vitals:  142/90, sats 97% ra, hr 82, resp 16

## 2015-06-19 NOTE — ED Notes (Signed)
Pt ambulated to BR with assistance.

## 2015-06-20 ENCOUNTER — Telehealth: Payer: Self-pay | Admitting: Endocrinology

## 2015-06-20 MED ORDER — HYDROCODONE-ACETAMINOPHEN 5-325 MG PO TABS: 2.0000 | ORAL_TABLET | ORAL | Status: DC | PRN

## 2015-06-20 NOTE — Telephone Encounter (Signed)
I contacted the pt and advised hydrocodone has been printed and the pt can come by and pick the medication up. Requested a call back if the pt would like to discuss. Rx placed up front.

## 2015-06-20 NOTE — Telephone Encounter (Signed)
Left a vm advising of note below. Pt scheduled for 06/21/2015 at 415.

## 2015-06-20 NOTE — Telephone Encounter (Signed)
PT said she had a really bad fall two days ago and spent yesterday in the hosptial and she said she can barely move and wants to know if she needs to come in tomorrow or if Dr. Everardo AllEllison can just write her an Rx for something for the pain.

## 2015-06-20 NOTE — Telephone Encounter (Signed)
i printed 

## 2015-06-20 NOTE — Telephone Encounter (Signed)
Patient ask if you could send her enough pain medication until she see him tomorrow, please advise

## 2015-06-20 NOTE — Telephone Encounter (Signed)
Please advise ov now, or tomorrow

## 2015-06-20 NOTE — Telephone Encounter (Signed)
See note below and please advise, Thanks! 

## 2015-06-20 NOTE — Telephone Encounter (Signed)
See note below and please advise. Pt was advised we could not until the appointment earlier, but wanted to check again. Thanks!

## 2015-06-21 ENCOUNTER — Ambulatory Visit: Payer: BLUE CROSS/BLUE SHIELD | Admitting: Endocrinology

## 2015-06-21 ENCOUNTER — Telehealth: Payer: Self-pay | Admitting: Endocrinology

## 2015-06-21 ENCOUNTER — Ambulatory Visit
Admission: RE | Admit: 2015-06-21 | Discharge: 2015-06-21 | Disposition: A | Discharge status: Home / Self Care | Payer: BLUE CROSS/BLUE SHIELD | Source: Ambulatory Visit | Attending: Endocrinology | Admitting: Endocrinology

## 2015-06-21 ENCOUNTER — Encounter: Payer: Self-pay | Admitting: Endocrinology

## 2015-06-21 ENCOUNTER — Ambulatory Visit (INDEPENDENT_AMBULATORY_CARE_PROVIDER_SITE_OTHER): Payer: BLUE CROSS/BLUE SHIELD | Admitting: Endocrinology

## 2015-06-21 VITALS — BP 132/88 | HR 67 | Temp 98.1°F

## 2015-06-21 DIAGNOSIS — S32009A Unspecified fracture of unspecified lumbar vertebra, initial encounter for closed fracture: Secondary | ICD-10-CM | POA: Insufficient documentation

## 2015-06-21 DIAGNOSIS — S32019A Unspecified fracture of first lumbar vertebra, initial encounter for closed fracture: Secondary | ICD-10-CM | POA: Diagnosis not present

## 2015-06-21 DIAGNOSIS — S300XXA Contusion of lower back and pelvis, initial encounter: Secondary | ICD-10-CM

## 2015-06-21 MED ORDER — ONDANSETRON 4 MG PO TBDP: 4.0000 mg | ORAL_TABLET | Freq: Four times a day (QID) | ORAL | Status: DC | PRN

## 2015-06-21 MED ORDER — OXYCODONE HCL 5 MG PO TABS: 5.0000 mg | ORAL_TABLET | ORAL | Status: DC | PRN

## 2015-06-21 NOTE — Telephone Encounter (Signed)
Pt is asking if we can quickly move along getting the MRI schedule at any place that can get her in the soonest she is in severe pain

## 2015-06-21 NOTE — Patient Instructions (Addendum)
X-rays are requested for you today.  We'll let you know about the results.  Please avoid alprazolam when you are taking these strong pain meds.   i have destroyed the vicodin prescription from yesterday.   Here is a prescription for stronger pain pill, and one for nausea.

## 2015-06-21 NOTE — Progress Notes (Signed)
Subjective:    Patient ID: Alyssa Garner, female    DOB: 08-28-1965, 50 y.o.   MRN: [id number]  HPI 2 days ago, pt fell at home, striking her lower back.  Since then, she has severe pain there, and assoc nausea.  She was seen in ER.   Past Medical History  Diagnosis Date  . Long-term current use of steroids   . HYPOTHYROIDISM 04/09/2008  . LOW BACK PAIN 12/13/2006  . Anxiety   . Osteoporosis     no treatment  . GERD (gastroesophageal reflux disease)     occ OTC not a problem now  . Complication of anesthesia 1997    pt states with her first CS she had pain and felt bad  . S/P Davinci total laparoscopic hysterectomy with bilateral salpingectomy 11/29/2014  . ETOH abuse     Past Surgical History  Procedure Laterality Date  . Tubal ligation    . Cesarean section      times three  . Breast enhancement surgery      Social History   Social History  . Marital Status: Married    Spouse Name: N/A  . Number of Children: 3  . Years of Education: N/A   Occupational History  .  [employer]   Social History Main Topics  . Smoking status: Former Games developer  . Smokeless tobacco: Never Used  . Alcohol Use: Yes     Comment: socially  . Drug Use: No  . Sexual Activity: Yes    Birth Control/ Protection: Surgical   Other Topics Concern  . Not on file   Social History Narrative    Current Outpatient Prescriptions on File Prior to Visit  Medication Sig Dispense Refill  . ALPRAZolam (XANAX) 0.5 MG tablet Take 1 tablet (0.5 mg total) by mouth 3 (three) times daily. 90 tablet 5  . bisacodyl (DULCOLAX) 5 MG EC tablet Take 10 mg by mouth daily as needed for moderate constipation.    . chlordiazePOXIDE (LIBRIUM) 10 MG capsule Take 10 mg by mouth 2 (two) times daily.    . cyclobenzaprine (FLEXERIL) 10 MG tablet Take 1 tablet (10 mg total) by mouth 2 (two) times daily as needed for muscle spasms. 20 tablet 0  . FLUoxetine (PROZAC) 40 MG capsule Take 2 capsules (80 mg total)  by mouth daily. 180 capsule 3  . HYDROcodone-acetaminophen (NORCO/VICODIN) 5-325 MG tablet Take 2 tablets by mouth every 4 (four) hours as needed. 30 tablet 0  . ibuprofen (ADVIL,MOTRIN) 600 MG tablet Take 1 tablet (600 mg total) by mouth every 6 (six) hours as needed. 30 tablet 0  . levothyroxine (SYNTHROID, LEVOTHROID) 175 MCG tablet Take 1 tablet (175 mcg total) by mouth daily before breakfast. 90 tablet 3  . Melatonin 5 MG TABS Take 5 mg by mouth at bedtime as needed (sleep).    . Multiple Vitamin (MULTIVITAMIN WITH MINERALS) TABS tablet Take 1 tablet by mouth daily.    . Probiotic Product (PROBIOTIC DAILY PO) Take 1 capsule by mouth daily.     . Simethicone (GAS-X PO) Take 2 tablets by mouth daily as needed (gas).    . cephALEXin (KEFLEX) 500 MG capsule Take 1 capsule (500 mg total) by mouth 3 (three) times daily. (Patient not taking: Reported on 06/21/2015) 21 capsule 0  . zolpidem (AMBIEN) 10 MG tablet Take 1 tablet (10 mg total) by mouth at bedtime as needed for sleep. 90 tablet 1   No current facility-administered medications on file  prior to visit.    Allergies  Allergen Reactions  . Trazodone And Nefazodone     nightmares    Family History  Problem Relation Age of Onset  . Adopted: Yes    BP 132/88 mmHg  Pulse 67  Temp(Src) 98.1 F (36.7 C) (Oral)  Ht   Wt   SpO2 97%  Review of Systems No numbness of the feet.  No bowel or bladder retention.      Objective:   Physical Exam VITAL SIGNS:  See vs page.  GENERAL: slight distress, due to pain.  In wheelchair.  Spine: moderate tenderness at the lower spine.   Neuro: sensation is intact to touch on the feet.    X-rays: acute comp fx of L-1 (I discussed with Dr. Grace IsaacWatts).      Assessment & Plan:  Compression fracture, new. Anxiety.  Xanax can cause an interaction with oxy-IR.  Nausea: prob due to severe pain.     Patient is advised the following: Patient Instructions  X-rays are requested for you today.  We'll let  you know about the results.  Please avoid alprazolam when you are taking these strong pain meds.   i have destroyed the vicodin prescription from yesterday.   Here is a prescription for stronger pain pill, and one for nausea.    addendum: i called pt, and gave x-ray results.  Check MRI and ref to consider kyphoplasty.

## 2015-06-22 ENCOUNTER — Ambulatory Visit
Admission: RE | Admit: 2015-06-22 | Discharge: 2015-06-22 | Disposition: A | Discharge status: Home / Self Care | Payer: BLUE CROSS/BLUE SHIELD | Source: Ambulatory Visit | Attending: Endocrinology | Admitting: Endocrinology

## 2015-06-22 DIAGNOSIS — S32019A Unspecified fracture of first lumbar vertebra, initial encounter for closed fracture: Secondary | ICD-10-CM

## 2015-06-24 ENCOUNTER — Other Ambulatory Visit (HOSPITAL_COMMUNITY): Payer: Self-pay | Admitting: Interventional Radiology

## 2015-06-24 ENCOUNTER — Telehealth: Payer: Self-pay | Admitting: Endocrinology

## 2015-06-24 ENCOUNTER — Telehealth: Payer: Self-pay | Admitting: *Deleted

## 2015-06-24 ENCOUNTER — Other Ambulatory Visit: Payer: Self-pay | Admitting: Endocrinology

## 2015-06-24 DIAGNOSIS — S32010A Wedge compression fracture of first lumbar vertebra, initial encounter for closed fracture: Secondary | ICD-10-CM

## 2015-06-24 DIAGNOSIS — M545 Low back pain: Secondary | ICD-10-CM

## 2015-06-24 DIAGNOSIS — IMO0002 Reserved for concepts with insufficient information to code with codable children: Secondary | ICD-10-CM

## 2015-06-24 NOTE — Telephone Encounter (Signed)
Called pt and advised her per Dr Remus BlakeKumar's message. Advised pt [facility] to be handled [facility]. Pt in a lot of pain, but voiced understanding.

## 2015-06-24 NOTE — Telephone Encounter (Signed)
Bjorn LoserRhonda, Could you please ask Dr. Lucianne MussKumar to review this order and advise the pt of the results during Dr. George HughEllison's absence? Thanks!

## 2015-06-24 NOTE — Telephone Encounter (Signed)
Patient is calling for th result of MRI.

## 2015-06-24 NOTE — Telephone Encounter (Signed)
Pt called stating that she needs the results from the MRI. Pt is in a lot of pain. Please read through pt's chart and advise of results. Also, pt wants to know if a referral can be or has been put in for her to see a physician to help with the broken vertebrae. Please advise.

## 2015-06-24 NOTE — Telephone Encounter (Signed)
Please see below and advise.

## 2015-06-24 NOTE — Telephone Encounter (Signed)
She has a fracture and have send the urgent consultation to radiologist for cement injection

## 2015-06-25 ENCOUNTER — Encounter (HOSPITAL_COMMUNITY): Payer: Self-pay | Admitting: *Deleted

## 2015-06-25 ENCOUNTER — Ambulatory Visit (HOSPITAL_COMMUNITY)
Admission: RE | Admit: 2015-06-25 | Discharge: 2015-06-25 | Disposition: A | Discharge status: Home / Self Care | Payer: BLUE CROSS/BLUE SHIELD | Source: Ambulatory Visit | Attending: Interventional Radiology | Admitting: Interventional Radiology

## 2015-06-25 ENCOUNTER — Inpatient Hospital Stay (HOSPITAL_COMMUNITY): Payer: BLUE CROSS/BLUE SHIELD

## 2015-06-25 ENCOUNTER — Inpatient Hospital Stay (HOSPITAL_COMMUNITY)
Admission: EM | Admit: 2015-06-25 | Discharge: 2015-07-02 | DRG: 457 | Disposition: A | Discharge status: Home / Self Care | Payer: BLUE CROSS/BLUE SHIELD | Attending: Neurological Surgery | Admitting: Neurological Surgery

## 2015-06-25 DIAGNOSIS — R140 Abdominal distension (gaseous): Secondary | ICD-10-CM

## 2015-06-25 DIAGNOSIS — S32011A Stable burst fracture of first lumbar vertebra, initial encounter for closed fracture: Secondary | ICD-10-CM | POA: Diagnosis present

## 2015-06-25 DIAGNOSIS — M545 Low back pain: Secondary | ICD-10-CM

## 2015-06-25 DIAGNOSIS — K219 Gastro-esophageal reflux disease without esophagitis: Secondary | ICD-10-CM | POA: Diagnosis present

## 2015-06-25 DIAGNOSIS — F4323 Adjustment disorder with mixed anxiety and depressed mood: Secondary | ICD-10-CM | POA: Diagnosis present

## 2015-06-25 DIAGNOSIS — S32010A Wedge compression fracture of first lumbar vertebra, initial encounter for closed fracture: Secondary | ICD-10-CM

## 2015-06-25 DIAGNOSIS — Z90710 Acquired absence of both cervix and uterus: Secondary | ICD-10-CM

## 2015-06-25 DIAGNOSIS — Z87891 Personal history of nicotine dependence: Secondary | ICD-10-CM | POA: Diagnosis not present

## 2015-06-25 DIAGNOSIS — S32001A Stable burst fracture of unspecified lumbar vertebra, initial encounter for closed fracture: Secondary | ICD-10-CM | POA: Diagnosis present

## 2015-06-25 DIAGNOSIS — Z419 Encounter for procedure for purposes other than remedying health state, unspecified: Secondary | ICD-10-CM

## 2015-06-25 DIAGNOSIS — Z888 Allergy status to other drugs, medicaments and biological substances status: Secondary | ICD-10-CM | POA: Diagnosis not present

## 2015-06-25 DIAGNOSIS — IMO0002 Reserved for concepts with insufficient information to code with codable children: Secondary | ICD-10-CM

## 2015-06-25 DIAGNOSIS — E039 Hypothyroidism, unspecified: Secondary | ICD-10-CM | POA: Diagnosis present

## 2015-06-25 DIAGNOSIS — M810 Age-related osteoporosis without current pathological fracture: Secondary | ICD-10-CM | POA: Diagnosis present

## 2015-06-25 DIAGNOSIS — S300XXA Contusion of lower back and pelvis, initial encounter: Secondary | ICD-10-CM | POA: Diagnosis present

## 2015-06-25 DIAGNOSIS — Z79899 Other long term (current) drug therapy: Secondary | ICD-10-CM

## 2015-06-25 DIAGNOSIS — K567 Ileus, unspecified: Secondary | ICD-10-CM | POA: Diagnosis not present

## 2015-06-25 DIAGNOSIS — R109 Unspecified abdominal pain: Secondary | ICD-10-CM

## 2015-06-25 HISTORY — DX: Depression, unspecified: F32.A

## 2015-06-25 HISTORY — DX: Renal tubulo-interstitial disease, unspecified: N15.9

## 2015-06-25 HISTORY — DX: Cardiac murmur, unspecified: R01.1

## 2015-06-25 HISTORY — DX: Major depressive disorder, single episode, unspecified: F32.9

## 2015-06-25 HISTORY — DX: Unspecified osteoarthritis, unspecified site: M19.90

## 2015-06-25 LAB — CBC
HCT: 41.2 % (ref 36.0–46.0)
Hemoglobin: 13.9 g/dL (ref 12.0–15.0)
MCH: 30.8 pg (ref 26.0–34.0)
MCHC: 33.7 g/dL (ref 30.0–36.0)
MCV: 91.2 fL (ref 78.0–100.0)
PLATELETS: 274 10*3/uL (ref 150–400)
RBC: 4.52 MIL/uL (ref 3.87–5.11)
RDW: 12.4 % (ref 11.5–15.5)
WBC: 8.7 10*3/uL (ref 4.0–10.5)

## 2015-06-25 LAB — BASIC METABOLIC PANEL
ANION GAP: 13 (ref 5–15)
Anion gap: 11 (ref 5–15)
BUN: 9 mg/dL (ref 6–20)
BUN: 8 mg/dL (ref 6–20)
CALCIUM: 9.3 mg/dL (ref 8.9–10.3)
CO2: 25 mmol/L (ref 22–32)
CO2: 26 mmol/L (ref 22–32)
CREATININE: 0.74 mg/dL (ref 0.44–1.00)
CREATININE: 0.83 mg/dL (ref 0.44–1.00)
Calcium: 9 mg/dL (ref 8.9–10.3)
Chloride: 103 mmol/L (ref 101–111)
Chloride: 97 mmol/L — ABNORMAL LOW (ref 101–111)
GFR calc Af Amer: >60 mL/min (ref >60)
GLUCOSE: 100 mg/dL — AB (ref 65–99)
GLUCOSE: 107 mg/dL — AB (ref 65–99)
POTASSIUM: 4.3 mmol/L (ref 3.5–5.1)
Potassium: 4.7 mmol/L (ref 3.5–5.1)
SODIUM: 139 mmol/L (ref 135–145)
Sodium: 136 mmol/L (ref 135–145)

## 2015-06-25 LAB — URINALYSIS, ROUTINE W REFLEX MICROSCOPIC
BILIRUBIN URINE: NEGATIVE
GLUCOSE, UA: NEGATIVE mg/dL
HGB URINE DIPSTICK: NEGATIVE
Ketones, ur: NEGATIVE mg/dL
Leukocytes, UA: NEGATIVE
Nitrite: NEGATIVE
PROTEIN: NEGATIVE mg/dL
SPECIFIC GRAVITY, URINE: 1.008 (ref 1.005–1.030)
pH: 5.5 (ref 5.0–8.0)

## 2015-06-25 LAB — CBC WITH DIFFERENTIAL/PLATELET
Basophils Absolute: 0 10*3/uL (ref 0.0–0.1)
Basophils Relative: 0 %
EOS ABS: 0.4 10*3/uL (ref 0.0–0.7)
EOS PCT: 4 %
HCT: 40.3 % (ref 36.0–46.0)
Hemoglobin: 13.3 g/dL (ref 12.0–15.0)
LYMPHS ABS: 1.9 10*3/uL (ref 0.7–4.0)
LYMPHS PCT: 21 %
MCH: 30.4 pg (ref 26.0–34.0)
MCHC: 33 g/dL (ref 30.0–36.0)
MCV: 92.2 fL (ref 78.0–100.0)
MONO ABS: 0.7 10*3/uL (ref 0.1–1.0)
MONOS PCT: 8 %
Neutro Abs: 6.1 10*3/uL (ref 1.7–7.7)
Neutrophils Relative %: 67 %
PLATELETS: 260 10*3/uL (ref 150–400)
RBC: 4.37 MIL/uL (ref 3.87–5.11)
RDW: 12.3 % (ref 11.5–15.5)
WBC: 9.2 10*3/uL (ref 4.0–10.5)

## 2015-06-25 LAB — ABO/RH: ABO/RH(D): B POS

## 2015-06-25 LAB — GLUCOSE, CAPILLARY
GLUCOSE-CAPILLARY: 57 mg/dL — AB (ref 65–99)
Glucose-Capillary: 105 mg/dL — ABNORMAL HIGH (ref 65–99)

## 2015-06-25 LAB — TYPE AND SCREEN
ABO/RH(D): B POS
Antibody Screen: NEGATIVE

## 2015-06-25 LAB — PROTIME-INR
INR: 0.92 (ref 0.00–1.49)
Prothrombin Time: 12.5 seconds (ref 11.6–15.2)

## 2015-06-25 LAB — APTT: APTT: 30 s (ref 24–37)

## 2015-06-25 LAB — HCG, SERUM, QUALITATIVE: Preg, Serum: NEGATIVE

## 2015-06-25 MED ORDER — SODIUM CHLORIDE 0.9 % IV SOLN
INTRAVENOUS | Status: DC
  Administered 2015-06-25: 22:00:00 via INTRAVENOUS

## 2015-06-25 MED ORDER — ONDANSETRON 4 MG PO TBDP: 4.0000 mg | ORAL_TABLET | Freq: Four times a day (QID) | ORAL | Status: DC | PRN

## 2015-06-25 MED ORDER — CEFAZOLIN SODIUM-DEXTROSE 2-4 GM/100ML-% IV SOLN: 2.0000 g | INTRAVENOUS | Status: DC

## 2015-06-25 MED ORDER — OXYCODONE HCL ER 20 MG PO T12A
20.0000 mg | EXTENDED_RELEASE_TABLET | Freq: Two times a day (BID) | ORAL | Status: DC
  Administered 2015-06-25 – 2015-07-01 (×12): 20 mg via ORAL
  Filled 2015-06-25 (×12): qty 1

## 2015-06-25 MED ORDER — BISACODYL 5 MG PO TBEC
10.0000 mg | DELAYED_RELEASE_TABLET | Freq: Every day | ORAL | Status: DC | PRN
  Administered 2015-06-26 – 2015-06-30 (×3): 10 mg via ORAL
  Filled 2015-06-25 (×3): qty 2

## 2015-06-25 MED ORDER — KETOROLAC TROMETHAMINE 30 MG/ML IJ SOLN
30.0000 mg | Freq: Once | INTRAMUSCULAR | Status: AC
  Administered 2015-06-25: 30 mg via INTRAVENOUS
  Filled 2015-06-25: qty 1

## 2015-06-25 MED ORDER — SODIUM CHLORIDE 0.9% FLUSH
3.0000 mL | Freq: Two times a day (BID) | INTRAVENOUS | Status: DC
  Administered 2015-06-25 – 2015-06-30 (×10): 3 mL via INTRAVENOUS

## 2015-06-25 MED ORDER — MENTHOL 3 MG MT LOZG: 1.0000 | LOZENGE | OROMUCOSAL | Status: DC | PRN

## 2015-06-25 MED ORDER — DIPHENHYDRAMINE HCL 50 MG/ML IJ SOLN
50.0000 mg | Freq: Once | INTRAMUSCULAR | Status: AC
  Administered 2015-06-25: 50 mg via INTRAVENOUS
  Filled 2015-06-25: qty 1

## 2015-06-25 MED ORDER — FENTANYL CITRATE (PF) 100 MCG/2ML IJ SOLN
50.0000 ug | INTRAMUSCULAR | Status: DC | PRN
  Administered 2015-06-25: 50 ug via NASAL

## 2015-06-25 MED ORDER — GABAPENTIN 100 MG PO CAPS
200.0000 mg | ORAL_CAPSULE | Freq: Three times a day (TID) | ORAL | Status: DC
  Administered 2015-06-25 – 2015-07-01 (×16): 200 mg via ORAL
  Filled 2015-06-25 (×16): qty 2

## 2015-06-25 MED ORDER — FLEET ENEMA 7-19 GM/118ML RE ENEM
1.0000 | ENEMA | Freq: Once | RECTAL | Status: AC | PRN
  Administered 2015-06-27: 1 via RECTAL
  Filled 2015-06-25: qty 1

## 2015-06-25 MED ORDER — LEVOTHYROXINE SODIUM 75 MCG PO TABS
175.0000 ug | ORAL_TABLET | Freq: Every day | ORAL | Status: DC
  Administered 2015-06-26 – 2015-07-02 (×7): 175 ug via ORAL
  Filled 2015-06-25 (×7): qty 1

## 2015-06-25 MED ORDER — FLUOXETINE HCL 20 MG PO CAPS
80.0000 mg | ORAL_CAPSULE | Freq: Every day | ORAL | Status: DC
  Administered 2015-06-26 – 2015-07-02 (×7): 80 mg via ORAL
  Filled 2015-06-25 (×7): qty 4

## 2015-06-25 MED ORDER — DOCUSATE SODIUM 100 MG PO CAPS
100.0000 mg | ORAL_CAPSULE | Freq: Two times a day (BID) | ORAL | Status: DC
  Administered 2015-06-25 – 2015-07-02 (×14): 100 mg via ORAL
  Filled 2015-06-25 (×14): qty 1

## 2015-06-25 MED ORDER — MORPHINE SULFATE (PF) 2 MG/ML IV SOLN
2.0000 mg | INTRAVENOUS | Status: DC | PRN
  Administered 2015-06-25 – 2015-06-26 (×4): 2 mg via INTRAVENOUS
  Filled 2015-06-25 (×4): qty 1

## 2015-06-25 MED ORDER — METHOCARBAMOL 750 MG PO TABS
750.0000 mg | ORAL_TABLET | Freq: Four times a day (QID) | ORAL | Status: DC
  Administered 2015-06-25 – 2015-07-02 (×25): 750 mg via ORAL
  Filled 2015-06-25 (×26): qty 1

## 2015-06-25 MED ORDER — BISACODYL 5 MG PO TBEC: 5.0000 mg | DELAYED_RELEASE_TABLET | Freq: Every day | ORAL | Status: DC | PRN

## 2015-06-25 MED ORDER — SENNA 8.6 MG PO TABS
1.0000 | ORAL_TABLET | Freq: Two times a day (BID) | ORAL | Status: DC
  Administered 2015-06-25 – 2015-07-02 (×14): 8.6 mg via ORAL
  Filled 2015-06-25 (×14): qty 1

## 2015-06-25 MED ORDER — CHLORDIAZEPOXIDE HCL 5 MG PO CAPS
10.0000 mg | ORAL_CAPSULE | Freq: Two times a day (BID) | ORAL | Status: DC
  Administered 2015-06-26 – 2015-07-01 (×11): 10 mg via ORAL
  Filled 2015-06-25 (×11): qty 2

## 2015-06-25 MED ORDER — HYDROMORPHONE HCL 1 MG/ML IJ SOLN
1.0000 mg | INTRAMUSCULAR | Status: DC
  Administered 2015-06-25: 1 mg via INTRAVENOUS
  Filled 2015-06-25: qty 1

## 2015-06-25 MED ORDER — PHENOL 1.4 % MT LIQD: 1.0000 | OROMUCOSAL | Status: DC | PRN

## 2015-06-25 MED ORDER — ACETAMINOPHEN 500 MG PO TABS
1000.0000 mg | ORAL_TABLET | Freq: Four times a day (QID) | ORAL | Status: DC
  Administered 2015-06-25 – 2015-07-02 (×25): 1000 mg via ORAL
  Filled 2015-06-25 (×29): qty 2

## 2015-06-25 MED ORDER — DIAZEPAM 5 MG PO TABS
5.0000 mg | ORAL_TABLET | Freq: Once | ORAL | Status: AC
  Administered 2015-06-25: 5 mg via ORAL
  Filled 2015-06-25: qty 1

## 2015-06-25 MED ORDER — ADULT MULTIVITAMIN W/MINERALS CH
1.0000 | ORAL_TABLET | Freq: Every day | ORAL | Status: DC
  Administered 2015-06-26 – 2015-07-02 (×7): 1 via ORAL
  Filled 2015-06-25 (×7): qty 1

## 2015-06-25 MED ORDER — OXYCODONE HCL 5 MG PO TABS: 5.0000 mg | ORAL_TABLET | ORAL | Status: DC | PRN

## 2015-06-25 MED ORDER — OXYCODONE HCL 5 MG PO TABS
5.0000 mg | ORAL_TABLET | ORAL | Status: DC | PRN
  Administered 2015-06-26 – 2015-07-02 (×10): 5 mg via ORAL
  Filled 2015-06-25 (×10): qty 1

## 2015-06-25 MED ORDER — SODIUM CHLORIDE 0.9 % IV SOLN: 250.0000 mL | INTRAVENOUS | Status: DC

## 2015-06-25 MED ORDER — ZOLPIDEM TARTRATE 5 MG PO TABS: 5.0000 mg | ORAL_TABLET | Freq: Every evening | ORAL | Status: DC | PRN

## 2015-06-25 MED ORDER — ZOLPIDEM TARTRATE 5 MG PO TABS
5.0000 mg | ORAL_TABLET | Freq: Every evening | ORAL | Status: DC | PRN
  Administered 2015-06-26 – 2015-07-01 (×6): 5 mg via ORAL
  Filled 2015-06-25 (×6): qty 1

## 2015-06-25 MED ORDER — FENTANYL CITRATE (PF) 100 MCG/2ML IJ SOLN
INTRAMUSCULAR | Status: AC
  Filled 2015-06-25: qty 2

## 2015-06-25 MED ORDER — ONDANSETRON HCL 4 MG/2ML IJ SOLN
4.0000 mg | INTRAMUSCULAR | Status: DC | PRN
  Administered 2015-06-26: 4 mg via INTRAVENOUS

## 2015-06-25 MED ORDER — ALPRAZOLAM 0.5 MG PO TABS
0.5000 mg | ORAL_TABLET | Freq: Three times a day (TID) | ORAL | Status: DC | PRN
  Administered 2015-06-26 – 2015-07-02 (×12): 0.5 mg via ORAL
  Filled 2015-06-25 (×12): qty 1

## 2015-06-25 MED ORDER — HYDROMORPHONE HCL 1 MG/ML IJ SOLN: 1.0000 mg | INTRAMUSCULAR | Status: DC | PRN

## 2015-06-25 MED ORDER — SODIUM CHLORIDE 0.9% FLUSH: 3.0000 mL | INTRAVENOUS | Status: DC | PRN

## 2015-06-25 MED ORDER — PANTOPRAZOLE SODIUM 40 MG IV SOLR
40.0000 mg | Freq: Every day | INTRAVENOUS | Status: DC
  Administered 2015-06-25 – 2015-06-29 (×5): 40 mg via INTRAVENOUS
  Filled 2015-06-25 (×5): qty 40

## 2015-06-25 MED ORDER — MELATONIN 3 MG PO TABS
3.0000 mg | ORAL_TABLET | Freq: Every evening | ORAL | Status: DC | PRN
  Filled 2015-06-25: qty 1

## 2015-06-25 MED ORDER — HYDROMORPHONE HCL 1 MG/ML IJ SOLN
2.0000 mg | Freq: Once | INTRAMUSCULAR | Status: AC
  Administered 2015-06-25: 2 mg via INTRAMUSCULAR
  Filled 2015-06-25: qty 2

## 2015-06-25 MED ORDER — CYCLOBENZAPRINE HCL 10 MG PO TABS
10.0000 mg | ORAL_TABLET | Freq: Two times a day (BID) | ORAL | Status: DC | PRN
  Administered 2015-06-26: 10 mg via ORAL
  Filled 2015-06-25: qty 1

## 2015-06-25 NOTE — ED Provider Notes (Signed)
CSN: [id number]     Arrival date & time 06/25/15  1303 History   First MD Initiated Contact with Patient 06/25/15 1458     Chief Complaint  Patient presents with  . Back Pain     (Consider location/radiation/quality/duration/timing/severity/associated sxs/prior Treatment) HPI 50 year old female who presents with low back pain. History of hypothyroidism. Was seen in ED 4/5 after mechanical fall onto her back. Seen for low back pain which was initially felt to be MSK in nature by EDP. She was sent home with analgesics and muscle relaxants. States that she was in severe pain at home and had been bedbound for majority of this week. Was seen by her primary care doctor on April 7 and sent for x-ray that was concerning for L1 fracture. Sent for MRI the next day which showed acute/subacute L1 vertebral body compression fracture with 7 mm retropulsion causing spinal stenosis. Was seen in consultation with IR today for possible cement injection/kyphoplasty and sent to ED for pain control. Feels mild numbness over side of the right leg. Denies bowel or urinary incontinence. Does report constipation and having difficulty initiating urine.   Past Medical History  Diagnosis Date  . Long-term current use of steroids   . HYPOTHYROIDISM 04/09/2008  . LOW BACK PAIN 12/13/2006  . Anxiety   . Osteoporosis     no treatment  . GERD (gastroesophageal reflux disease)     occ OTC not a problem now  . Complication of anesthesia 1997    pt states with her first CS she had pain and felt bad  . S/P Davinci total laparoscopic hysterectomy with bilateral salpingectomy 11/29/2014  . ETOH abuse    Past Surgical History  Procedure Laterality Date  . Tubal ligation    . Cesarean section      times three  . Breast enhancement surgery     Family History  Problem Relation Age of Onset  . Adopted: Yes   Social History  Substance Use Topics  . Smoking status: Former Games developermoker  . Smokeless tobacco: Never Used  .  Alcohol Use: Yes     Comment: socially   OB History    No data available     Review of Systems 10/14 systems reviewed and are negative other than those stated in the HPI    Allergies  Trazodone and nefazodone  Home Medications   Prior to Admission medications   Medication Sig Start Date End Date Taking? Authorizing Provider  ALPRAZolam Prudy Feeler(XANAX) 0.5 MG tablet Take 1 tablet (0.5 mg total) by mouth 3 (three) times daily. 05/14/15  Yes Romero BellingSean Ellison, MD  bisacodyl (DULCOLAX) 5 MG EC tablet Take 10 mg by mouth daily as needed for moderate constipation.   Yes Historical Provider, MD  cyclobenzaprine (FLEXERIL) 10 MG tablet Take 1 tablet (10 mg total) by mouth 2 (two) times daily as needed for muscle spasms. 06/19/15  Yes Bethel BornKelly Marie Gekas, PA-C  FLUoxetine (PROZAC) 40 MG capsule Take 2 capsules (80 mg total) by mouth daily. 05/14/15  Yes Romero BellingSean Ellison, MD  HYDROcodone-acetaminophen (NORCO/VICODIN) 5-325 MG tablet Take 2 tablets by mouth every 4 (four) hours as needed. 06/20/15  Yes Romero BellingSean Ellison, MD  ibuprofen (ADVIL,MOTRIN) 600 MG tablet Take 1 tablet (600 mg total) by mouth every 6 (six) hours as needed. 06/19/15  Yes Bethel BornKelly Marie Gekas, PA-C  levothyroxine (SYNTHROID, LEVOTHROID) 175 MCG tablet Take 1 tablet (175 mcg total) by mouth daily before breakfast. 05/14/15  Yes Romero BellingSean Ellison, MD  Melatonin 5  MG TABS Take 5 mg by mouth at bedtime as needed (sleep).   Yes Historical Provider, MD  Multiple Vitamin (MULTIVITAMIN WITH MINERALS) TABS tablet Take 1 tablet by mouth daily.   Yes Historical Provider, MD  ondansetron (ZOFRAN-ODT) 4 MG disintegrating tablet Take 1 tablet (4 mg total) by mouth every 6 (six) hours as needed for nausea or vomiting. 06/21/15  Yes Romero Belling, MD  oxyCODONE (OXY IR/ROXICODONE) 5 MG immediate release tablet Take 1 tablet (5 mg total) by mouth every 4 (four) hours as needed for severe pain. 06/21/15  Yes Romero Belling, MD  Probiotic Product (PROBIOTIC DAILY PO) Take 1 capsule by mouth  daily.    Yes Historical Provider, MD  Simethicone (GAS-X PO) Take 2 tablets by mouth daily as needed (gas).   Yes Historical Provider, MD  cephALEXin (KEFLEX) 500 MG capsule Take 1 capsule (500 mg total) by mouth 3 (three) times daily. Patient not taking: Reported on 06/21/2015 05/14/15   Romero Belling, MD  chlordiazePOXIDE (LIBRIUM) 10 MG capsule Take 10 mg by mouth 2 (two) times daily. 06/06/15   Historical Provider, MD  zolpidem (AMBIEN) 10 MG tablet Take 1 tablet (10 mg total) by mouth at bedtime as needed for sleep. 05/14/15 06/19/15  Romero Belling, MD   BP 130/91 mmHg  Pulse 89  Temp(Src) 98.3 F (36.8 C) (Oral)  Resp 18  SpO2 94% Physical Exam Physical Exam  Nursing note and vitals reviewed. Constitutional: Well developed, well nourished, non-toxic, and appears uncomfortable secondary to pain Head: Normocephalic and atraumatic.  Mouth/Throat: Oropharynx is clear and moist.  Neck: Normal range of motion. Neck supple.  Cardiovascular: Normal rate and regular rhythm.   Pulmonary/Chest: Effort normal and breath sounds normal.  Abdominal: Soft. There is no tenderness. There is no rebound and no guarding.  Musculoskeletal: No deformities. Tender around L1 aspect of low back Neurological: Alert, no facial droop, fluent speech Full strength ankle/dorsiflexion Sensation to light touch mildly diminished over lateral aspect of the right leg +2 patellar and achilles reflexes Skin: Skin is warm and dry.  Psychiatric: Cooperative  ED Course  Procedures (including critical care time) Labs Review Labs Reviewed  BASIC METABOLIC PANEL - Abnormal; Notable for the following:    Glucose, Bld 100 (*)    All other components within normal limits  HCG, SERUM, QUALITATIVE  CBC WITH DIFFERENTIAL/PLATELET  URINALYSIS, ROUTINE W REFLEX MICROSCOPIC (NOT AT [facility])    Imaging Review No results found. I have personally reviewed and evaluated these images and lab results as part of my medical  decision-making.   EKG Interpretation None      MDM   Final diagnoses:  Closed compression fracture of L1 lumbar vertebral body    50 year old female who presents with back pain secondary to L1 burst fracture. On spinal precautions on arrival. Some mild reported numbness over lateral aspect of the RLE but otherwise neuro in tact without complaints of bowel incontinence or urinary retention. Will obtain post void residual.   4:00PM Spoke with Dr. Bevely Palmer from neurosurgery who will come to see patient. Patient likely requiring surgery and felt not the greatest candidate for kyphoplasty. Will admit patient to neurosurgery service for pain control and operative management tomorrow.   Lavera Guise, MD 06/25/15 1723

## 2015-06-25 NOTE — ED Notes (Signed)
IV team consulted- 

## 2015-06-25 NOTE — ED Notes (Signed)
This RN assisted pt. To bathroom with wheelchair.

## 2015-06-25 NOTE — ED Notes (Signed)
This RN attempted to site IV with no success.

## 2015-06-25 NOTE — ED Notes (Signed)
Will obtain vitals when pt is more calm, pt demanding pain meds at this time

## 2015-06-25 NOTE — ED Notes (Signed)
Pt requesting to speak with a charge nurse, pt is demanding an IV & a bed, Lowella BandyNikki, Consulting civil engineerCharge RN informed, pt will remain in Triage Room 4 at this time

## 2015-06-25 NOTE — ED Notes (Signed)
This RN attempted IV x2 with no success 

## 2015-06-25 NOTE — H&P (Signed)
CC:  Chief Complaint  Patient presents with  . Back Pain    HPI: Alyssa Garner is a 50 y.o. female with L1 burst fracture after a fall while walking up the steps on 4/5.  She has been essentially bedbound since then because of pain.  She denies weakness or numbness in her legs.  She has not had urinary retention.  PMH: Past Medical History  Diagnosis Date  . Long-term current use of steroids   . HYPOTHYROIDISM 04/09/2008  . LOW BACK PAIN 12/13/2006  . Anxiety   . Osteoporosis     no treatment  . GERD (gastroesophageal reflux disease)     occ OTC not a problem now  . Complication of anesthesia 1997    pt states with her first CS she had pain and felt bad  . S/P Davinci total laparoscopic hysterectomy with bilateral salpingectomy 11/29/2014  . ETOH abuse     PSH: Past Surgical History  Procedure Laterality Date  . Tubal ligation    . Cesarean section      times three  . Breast enhancement surgery      SH: Social History  Substance Use Topics  . Smoking status: Former Games developer  . Smokeless tobacco: Never Used  . Alcohol Use: Yes     Comment: socially    MEDS: Prior to Admission medications   Medication Sig Start Date End Date Taking? Authorizing Provider  ALPRAZolam Prudy Feeler) 0.5 MG tablet Take 1 tablet (0.5 mg total) by mouth 3 (three) times daily. 05/14/15  Yes Romero Belling, MD  bisacodyl (DULCOLAX) 5 MG EC tablet Take 10 mg by mouth daily as needed for moderate constipation.   Yes Historical Provider, MD  cyclobenzaprine (FLEXERIL) 10 MG tablet Take 1 tablet (10 mg total) by mouth 2 (two) times daily as needed for muscle spasms. 06/19/15  Yes Bethel Born, PA-C  FLUoxetine (PROZAC) 40 MG capsule Take 2 capsules (80 mg total) by mouth daily. 05/14/15  Yes Romero Belling, MD  HYDROcodone-acetaminophen (NORCO/VICODIN) 5-325 MG tablet Take 2 tablets by mouth every 4 (four) hours as needed. 06/20/15  Yes Romero Belling, MD  ibuprofen (ADVIL,MOTRIN) 600 MG tablet Take 1 tablet  (600 mg total) by mouth every 6 (six) hours as needed. 06/19/15  Yes Bethel Born, PA-C  levothyroxine (SYNTHROID, LEVOTHROID) 175 MCG tablet Take 1 tablet (175 mcg total) by mouth daily before breakfast. 05/14/15  Yes Romero Belling, MD  Melatonin 5 MG TABS Take 5 mg by mouth at bedtime as needed (sleep).   Yes Historical Provider, MD  Multiple Vitamin (MULTIVITAMIN WITH MINERALS) TABS tablet Take 1 tablet by mouth daily.   Yes Historical Provider, MD  ondansetron (ZOFRAN-ODT) 4 MG disintegrating tablet Take 1 tablet (4 mg total) by mouth every 6 (six) hours as needed for nausea or vomiting. 06/21/15  Yes Romero Belling, MD  oxyCODONE (OXY IR/ROXICODONE) 5 MG immediate release tablet Take 1 tablet (5 mg total) by mouth every 4 (four) hours as needed for severe pain. 06/21/15  Yes Romero Belling, MD  Probiotic Product (PROBIOTIC DAILY PO) Take 1 capsule by mouth daily.    Yes Historical Provider, MD  Simethicone (GAS-X PO) Take 2 tablets by mouth daily as needed (gas).   Yes Historical Provider, MD  cephALEXin (KEFLEX) 500 MG capsule Take 1 capsule (500 mg total) by mouth 3 (three) times daily. Patient not taking: Reported on 06/21/2015 05/14/15   Romero Belling, MD  chlordiazePOXIDE (LIBRIUM) 10 MG capsule Take 10 mg by  mouth 2 (two) times daily. 06/06/15   Historical Provider, MD  zolpidem (AMBIEN) 10 MG tablet Take 1 tablet (10 mg total) by mouth at bedtime as needed for sleep. 05/14/15 06/19/15  Romero BellingSean Ellison, MD    ALLERGY: Allergies  Allergen Reactions  . Trazodone And Nefazodone     nightmares    ROS: ROS  NEUROLOGIC EXAM: Awake, alert, oriented Memory and concentration grossly intact Speech fluent, appropriate CN grossly intact Motor exam: Upper Extremities Deltoid Bicep Tricep Grip  Right 5/5 5/5 5/5 5/5  Left 5/5 5/5 5/5 5/5   Lower Extremity IP Quad PF DF EHL  Right 5/5 5/5 5/5 5/5 5/5  Left 5/5 5/5 5/5 5/5 5/5   Sensation grossly intact to LT  IMAGING: L1 burst fracture with  retropulsed fragments.  The cord is contacted by the fracture fragments but doesn't seem to be compressed.  IMPRESSION: - 50 y.o. female with L1 burst fracture and spinal instability.  She is seemingly neurologically intact, but we will obtain a PVR to assess urinary function.  I had a long discussion with the patient and her husband and explained the treatment options including bedrest, vertebroplasty, L1 corpectomy, and T10-L3 fixation and fusion.  I have recommended T10-L3 fixation and fusion.  She understands and wishes to proceed.  PLAN: - Admission for pain control - T10-L3 fixation and fusion tomorrow

## 2015-06-25 NOTE — ED Notes (Signed)
Lowella BandyNikki, Consulting civil engineerCharge RN speaking with pt

## 2015-06-25 NOTE — ED Notes (Signed)
Pt reports back pain post fall x 1 wk ago, [facility], [facility], pt sent home with Flexeril, Vicodin, & Motrin, pt seen PCP last Friday for symptoms, pt had an xray at her PCP office, pt had MRI Saturday, today pt went Interventional radiology today for a consult with Dr. Zacarias Ponteseveshware who requests the pt be seen here for pain management, pt c/o constipation, pt denies bowel & bladder incontinence, pt A&O x4

## 2015-06-25 NOTE — ED Notes (Signed)
Post Void Residual is 0.

## 2015-06-26 ENCOUNTER — Encounter (HOSPITAL_COMMUNITY)
Admission: EM | Disposition: A | Discharge status: Home / Self Care | Payer: Self-pay | Source: Home / Self Care | Attending: Neurological Surgery

## 2015-06-26 ENCOUNTER — Inpatient Hospital Stay (HOSPITAL_COMMUNITY): Payer: BLUE CROSS/BLUE SHIELD

## 2015-06-26 ENCOUNTER — Inpatient Hospital Stay (HOSPITAL_COMMUNITY): Payer: BLUE CROSS/BLUE SHIELD | Admitting: Anesthesiology

## 2015-06-26 LAB — CBC
HCT: 40.7 % (ref 36.0–46.0)
HEMOGLOBIN: 13.9 g/dL (ref 12.0–15.0)
MCH: 31.5 pg (ref 26.0–34.0)
MCHC: 34.2 g/dL (ref 30.0–36.0)
MCV: 92.3 fL (ref 78.0–100.0)
PLATELETS: 268 10*3/uL (ref 150–400)
RBC: 4.41 MIL/uL (ref 3.87–5.11)
RDW: 12.6 % (ref 11.5–15.5)
WBC: 7.4 10*3/uL (ref 4.0–10.5)

## 2015-06-26 LAB — BASIC METABOLIC PANEL
Anion gap: 12 (ref 5–15)
BUN: 9 mg/dL (ref 6–20)
CALCIUM: 8.9 mg/dL (ref 8.9–10.3)
CHLORIDE: 101 mmol/L (ref 101–111)
CO2: 23 mmol/L (ref 22–32)
CREATININE: 0.79 mg/dL (ref 0.44–1.00)
Glucose, Bld: 86 mg/dL (ref 65–99)
Potassium: 4.6 mmol/L (ref 3.5–5.1)
SODIUM: 136 mmol/L (ref 135–145)

## 2015-06-26 LAB — GLUCOSE, CAPILLARY: Glucose-Capillary: 81 mg/dL (ref 65–99)

## 2015-06-26 SURGERY — POSTERIOR LUMBAR FUSION 3 LEVEL
Anesthesia: General

## 2015-06-26 MED ORDER — SODIUM CHLORIDE 0.9 % IV SOLN
INTRAVENOUS | Status: DC
  Administered 2015-06-26: 1000 mL via INTRAVENOUS

## 2015-06-26 MED ORDER — THROMBIN 20000 UNITS EX SOLR
CUTANEOUS | Status: DC | PRN
  Administered 2015-06-26: 13:00:00 via TOPICAL

## 2015-06-26 MED ORDER — FENTANYL CITRATE (PF) 100 MCG/2ML IJ SOLN
INTRAMUSCULAR | Status: DC | PRN
  Administered 2015-06-26 (×4): 50 ug via INTRAVENOUS
  Administered 2015-06-26: 150 ug via INTRAVENOUS
  Administered 2015-06-26 (×3): 50 ug via INTRAVENOUS

## 2015-06-26 MED ORDER — MIDAZOLAM HCL 2 MG/2ML IJ SOLN
INTRAMUSCULAR | Status: AC
  Filled 2015-06-26: qty 2

## 2015-06-26 MED ORDER — SODIUM CHLORIDE 0.9 % IR SOLN
Status: DC | PRN
  Administered 2015-06-26 (×2)

## 2015-06-26 MED ORDER — CEFAZOLIN SODIUM 1-5 GM-% IV SOLN
1.0000 g | Freq: Three times a day (TID) | INTRAVENOUS | Status: AC
  Administered 2015-06-26 – 2015-06-27 (×2): 1 g via INTRAVENOUS
  Filled 2015-06-26 (×3): qty 50

## 2015-06-26 MED ORDER — FENTANYL CITRATE (PF) 250 MCG/5ML IJ SOLN
INTRAMUSCULAR | Status: AC
  Filled 2015-06-26: qty 5

## 2015-06-26 MED ORDER — VANCOMYCIN HCL 1000 MG IV SOLR
INTRAVENOUS | Status: DC | PRN
  Administered 2015-06-26: 1000 mg

## 2015-06-26 MED ORDER — SODIUM CHLORIDE 0.9% FLUSH
3.0000 mL | Freq: Two times a day (BID) | INTRAVENOUS | Status: DC
  Administered 2015-06-26 – 2015-07-01 (×8): 3 mL via INTRAVENOUS

## 2015-06-26 MED ORDER — EPHEDRINE SULFATE 50 MG/ML IJ SOLN
INTRAMUSCULAR | Status: DC | PRN
  Administered 2015-06-26: 10 mg via INTRAVENOUS

## 2015-06-26 MED ORDER — SUGAMMADEX SODIUM 200 MG/2ML IV SOLN
INTRAVENOUS | Status: DC | PRN
  Administered 2015-06-26: 150 mg via INTRAVENOUS

## 2015-06-26 MED ORDER — DEXAMETHASONE SODIUM PHOSPHATE 4 MG/ML IJ SOLN
INTRAMUSCULAR | Status: DC | PRN
  Administered 2015-06-26: 4 mg via INTRAVENOUS

## 2015-06-26 MED ORDER — SODIUM CHLORIDE 0.9% FLUSH: 3.0000 mL | INTRAVENOUS | Status: DC | PRN

## 2015-06-26 MED ORDER — VANCOMYCIN HCL 1000 MG IV SOLR
INTRAVENOUS | Status: AC
  Filled 2015-06-26: qty 1000

## 2015-06-26 MED ORDER — MORPHINE SULFATE (PF) 2 MG/ML IV SOLN
2.0000 mg | INTRAVENOUS | Status: AC | PRN
  Administered 2015-06-26 – 2015-06-28 (×8): 2 mg via INTRAVENOUS
  Filled 2015-06-26 (×8): qty 1

## 2015-06-26 MED ORDER — 0.9 % SODIUM CHLORIDE (POUR BTL) OPTIME
TOPICAL | Status: DC | PRN
  Administered 2015-06-26: 1000 mL

## 2015-06-26 MED ORDER — SUGAMMADEX SODIUM 200 MG/2ML IV SOLN
INTRAVENOUS | Status: AC
  Filled 2015-06-26: qty 2

## 2015-06-26 MED ORDER — ONDANSETRON HCL 4 MG/2ML IJ SOLN
INTRAMUSCULAR | Status: AC
  Filled 2015-06-26: qty 2

## 2015-06-26 MED ORDER — ROCURONIUM BROMIDE 100 MG/10ML IV SOLN
INTRAVENOUS | Status: DC | PRN
  Administered 2015-06-26 (×2): 50 mg via INTRAVENOUS

## 2015-06-26 MED ORDER — OXYCODONE HCL 5 MG PO TABS
ORAL_TABLET | ORAL | Status: AC
  Administered 2015-06-26: 5 mg via ORAL
  Filled 2015-06-26: qty 1

## 2015-06-26 MED ORDER — THROMBIN 5000 UNITS EX SOLR
OROMUCOSAL | Status: DC | PRN
  Administered 2015-06-26: 13:00:00 via TOPICAL

## 2015-06-26 MED ORDER — PHENYLEPHRINE HCL 10 MG/ML IJ SOLN
INTRAMUSCULAR | Status: DC | PRN
  Administered 2015-06-26: 80 ug via INTRAVENOUS
  Administered 2015-06-26: 40 ug via INTRAVENOUS
  Administered 2015-06-26: 80 ug via INTRAVENOUS

## 2015-06-26 MED ORDER — FENTANYL CITRATE (PF) 100 MCG/2ML IJ SOLN
INTRAMUSCULAR | Status: AC
  Administered 2015-06-26: 50 ug via INTRAVENOUS
  Filled 2015-06-26: qty 2

## 2015-06-26 MED ORDER — CEFAZOLIN SODIUM 1 G IJ SOLR
INTRAMUSCULAR | Status: DC | PRN
  Administered 2015-06-26: 2 g via INTRAMUSCULAR

## 2015-06-26 MED ORDER — PROPOFOL 10 MG/ML IV BOLUS
INTRAVENOUS | Status: DC | PRN
  Administered 2015-06-26: 150 mg via INTRAVENOUS

## 2015-06-26 MED ORDER — LIDOCAINE HCL (CARDIAC) 20 MG/ML IV SOLN
INTRAVENOUS | Status: DC | PRN
  Administered 2015-06-26: 80 mg via INTRAVENOUS

## 2015-06-26 MED ORDER — SODIUM CHLORIDE 0.9 % IV SOLN: 250.0000 mL | INTRAVENOUS | Status: DC

## 2015-06-26 MED ORDER — MIDAZOLAM HCL 5 MG/5ML IJ SOLN
INTRAMUSCULAR | Status: DC | PRN
  Administered 2015-06-26: 2 mg via INTRAVENOUS

## 2015-06-26 MED ORDER — PROPOFOL 10 MG/ML IV BOLUS
INTRAVENOUS | Status: AC
  Filled 2015-06-26: qty 20

## 2015-06-26 MED ORDER — BUPIVACAINE LIPOSOME 1.3 % IJ SUSP
20.0000 mL | INTRAMUSCULAR | Status: DC
  Filled 2015-06-26: qty 20

## 2015-06-26 MED ORDER — LACTATED RINGERS IV SOLN
INTRAVENOUS | Status: DC | PRN
  Administered 2015-06-26 (×3): via INTRAVENOUS

## 2015-06-26 MED ORDER — FENTANYL CITRATE (PF) 100 MCG/2ML IJ SOLN
25.0000 ug | INTRAMUSCULAR | Status: DC | PRN
  Administered 2015-06-26 (×2): 50 ug via INTRAVENOUS
  Administered 2015-06-26: 25 ug via INTRAVENOUS

## 2015-06-26 MED ORDER — LIDOCAINE-EPINEPHRINE 2 %-1:100000 IJ SOLN
INTRAMUSCULAR | Status: DC | PRN
  Administered 2015-06-26: 20 mL via INTRADERMAL

## 2015-06-26 MED ORDER — BUPIVACAINE LIPOSOME 1.3 % IJ SUSP
INTRAMUSCULAR | Status: DC | PRN
  Administered 2015-06-26: 20 mL

## 2015-06-26 MED ORDER — FENTANYL CITRATE (PF) 100 MCG/2ML IJ SOLN
INTRAMUSCULAR | Status: AC
  Administered 2015-06-26: 25 ug via INTRAVENOUS
  Filled 2015-06-26: qty 2

## 2015-06-26 SURGICAL SUPPLY — 85 items
ADH SKN CLS APL DERMABOND .7 (GAUZE/BANDAGES/DRESSINGS) ×1
APL SKNCLS STERI-STRIP NONHPOA (GAUZE/BANDAGES/DRESSINGS) ×1
BAG DECANTER FOR FLEXI CONT (MISCELLANEOUS) ×6 IMPLANT
BENZOIN TINCTURE PRP APPL 2/3 (GAUZE/BANDAGES/DRESSINGS) ×3 IMPLANT
BLADE CLIPPER SURG (BLADE) IMPLANT
BLADE SURG 11 STRL SS (BLADE) ×3 IMPLANT
BUR MATCHSTICK NEURO 3.0 LAGG (BURR) ×3 IMPLANT
BUR ROUND FLUTED 5 RND (BURR) ×2 IMPLANT
BUR ROUND FLUTED 5MM RND (BURR) ×1
CANISTER SUCT 3000ML PPV (MISCELLANEOUS) ×6 IMPLANT
CHLORAPREP W/TINT 26ML (MISCELLANEOUS) ×3 IMPLANT
CONNECTOR RELINE-O 35-45 5.5 (Connector) ×3 IMPLANT
DECANTER SPIKE VIAL GLASS SM (MISCELLANEOUS) ×3 IMPLANT
DERMABOND ADVANCED (GAUZE/BANDAGES/DRESSINGS) ×2
DERMABOND ADVANCED .7 DNX12 (GAUZE/BANDAGES/DRESSINGS) ×1 IMPLANT
DRAPE C-ARM 42X72 X-RAY (DRAPES) ×6 IMPLANT
DRAPE MICROSCOPE LEICA (MISCELLANEOUS) IMPLANT
DRAPE POUCH INSTRU U-SHP 10X18 (DRAPES) ×3 IMPLANT
DRAPE SURG 17X23 STRL (DRAPES) ×3 IMPLANT
DRSG OPSITE POSTOP 4X10 (GAUZE/BANDAGES/DRESSINGS) ×3 IMPLANT
DRSG OPSITE POSTOP 4X8 (GAUZE/BANDAGES/DRESSINGS) ×3 IMPLANT
ELECT REM PT RETURN 9FT ADLT (ELECTROSURGICAL) ×3
ELECTRODE REM PT RTRN 9FT ADLT (ELECTROSURGICAL) ×1 IMPLANT
EVACUATOR 1/8 PVC DRAIN (DRAIN) ×3 IMPLANT
GAUZE SPONGE 4X4 12PLY STRL (GAUZE/BANDAGES/DRESSINGS) IMPLANT
GAUZE SPONGE 4X4 16PLY XRAY LF (GAUZE/BANDAGES/DRESSINGS) IMPLANT
GLOVE BIO SURGEON STRL SZ7 (GLOVE) ×9 IMPLANT
GLOVE BIO SURGEON STRL SZ8 (GLOVE) ×3 IMPLANT
GLOVE BIOGEL PI IND STRL 7.5 (GLOVE) ×2 IMPLANT
GLOVE BIOGEL PI INDICATOR 7.5 (GLOVE) ×4
GLOVE EXAM NITRILE LRG STRL (GLOVE) IMPLANT
GLOVE EXAM NITRILE MD LF STRL (GLOVE) IMPLANT
GLOVE EXAM NITRILE XL STR (GLOVE) IMPLANT
GLOVE EXAM NITRILE XS STR PU (GLOVE) IMPLANT
GLOVE INDICATOR 7.5 STRL GRN (GLOVE) ×12 IMPLANT
GLOVE INDICATOR 8.5 STRL (GLOVE) ×3 IMPLANT
GLOVE SS BIOGEL STRL SZ 7 (GLOVE) ×3 IMPLANT
GLOVE SUPERSENSE BIOGEL SZ 7 (GLOVE) ×6
GOWN STRL REUS W/ TWL LRG LVL3 (GOWN DISPOSABLE) ×2 IMPLANT
GOWN STRL REUS W/ TWL XL LVL3 (GOWN DISPOSABLE) IMPLANT
GOWN STRL REUS W/TWL LRG LVL3 (GOWN DISPOSABLE) ×6
GOWN STRL REUS W/TWL XL LVL3 (GOWN DISPOSABLE)
HEMOSTAT POWDER KIT SURGIFOAM (HEMOSTASIS) ×3 IMPLANT
KIT BASIN OR (CUSTOM PROCEDURE TRAY) ×3 IMPLANT
KIT INFUSE LRG II (Orthopedic Implant) ×3 IMPLANT
KIT ROOM TURNOVER OR (KITS) ×3 IMPLANT
NEEDLE HYPO 21X1.5 SAFETY (NEEDLE) ×6 IMPLANT
NEEDLE HYPO 25X1 1.5 SAFETY (NEEDLE) ×3 IMPLANT
NEEDLE SPNL 18GX3.5 QUINCKE PK (NEEDLE) ×6 IMPLANT
NS IRRIG 1000ML POUR BTL (IV SOLUTION) ×3 IMPLANT
PACK LAMINECTOMY NEURO (CUSTOM PROCEDURE TRAY) ×3 IMPLANT
PACK UNIVERSAL I (CUSTOM PROCEDURE TRAY) ×3 IMPLANT
PAD ARMBOARD 7.5X6 YLW CONV (MISCELLANEOUS) ×9 IMPLANT
PATTIES SURGICAL .5X1.5 (GAUZE/BANDAGES/DRESSINGS) ×3 IMPLANT
PEDIGUARD CVD 3TIP 2.3X50 XS (MISCELLANEOUS) ×3 IMPLANT
PUTTY BONE ATTRAX 10CC STRIP (Putty) ×6 IMPLANT
ROD RELINE-O 5.5X300 STRT NS (Rod) ×2 IMPLANT
ROD RELINE-O 5.5X300MM STRT (Rod) ×4 IMPLANT
RUBBERBAND STERILE (MISCELLANEOUS) IMPLANT
SCREW LOCK RELINE 5.5 TULIP (Screw) ×36 IMPLANT
SCREW SHANK RELINE 5.5X50MM (Screw) ×12 IMPLANT
SCREW SHANK RELINE 6.5X50MM 2S (Screw) ×6 IMPLANT
SCREW SHANK RELINE-O 6.5X45MM (Screw) ×18 IMPLANT
SPINE TULIP RELINE MOD (Neuro Prosthesis/Implant) ×36 IMPLANT
SPONGE NEURO XRAY DETECT 1X3 (DISPOSABLE) ×3 IMPLANT
SPONGE SURGIFOAM ABS GEL 100 (HEMOSTASIS) ×3 IMPLANT
STRIP SURGICAL 1 X 6 IN (GAUZE/BANDAGES/DRESSINGS) IMPLANT
STRIP SURGICAL 1/2 X 6 IN (GAUZE/BANDAGES/DRESSINGS) IMPLANT
STRIP SURGICAL 1/4 X 6 IN (GAUZE/BANDAGES/DRESSINGS) IMPLANT
STRIP SURGICAL 3/4 X 6 IN (GAUZE/BANDAGES/DRESSINGS) IMPLANT
SUT STRATAFIX 1PDS 45CM VIOLET (SUTURE) ×3 IMPLANT
SUT VIC AB 0 CT1 18XCR BRD8 (SUTURE) ×2 IMPLANT
SUT VIC AB 0 CT1 8-18 (SUTURE) ×4
SUT VIC AB 2-0 CT1 18 (SUTURE) ×6 IMPLANT
SUT VIC AB 3-0 SH 8-18 (SUTURE) ×6 IMPLANT
SUT VIC AB 4-0 PS2 27 (SUTURE) ×3 IMPLANT
SUT VICRYL 4-0 PS2 18IN ABS (SUTURE) ×3 IMPLANT
SYR 20CC LL (SYRINGE) ×3 IMPLANT
SYR 30ML LL (SYRINGE) ×3 IMPLANT
TOWEL OR 17X24 6PK STRL BLUE (TOWEL DISPOSABLE) ×3 IMPLANT
TOWEL OR 17X26 10 PK STRL BLUE (TOWEL DISPOSABLE) ×3 IMPLANT
TUBE CONNECTING 12'X1/4 (SUCTIONS) ×1
TUBE CONNECTING 12X1/4 (SUCTIONS) ×2 IMPLANT
TULIP SPINE RELINE MOD (Neuro Prosthesis/Implant) ×12 IMPLANT
WATER STERILE IRR 1000ML POUR (IV SOLUTION) ×3 IMPLANT

## 2015-06-26 NOTE — Transfer of Care (Signed)
Immediate Anesthesia Transfer of Care Note  Patient: Alyssa Garner  Procedure(s) Performed: Procedure(s): THORACIC TEN TO LUMBAR THREE INTERNAL FIXATION AND FUSION OPEN REDUCTION OF LUMBAR ONE FRACTURE (No Interbody) (N/A)  Patient Location: PACU  Anesthesia Type:General  Level of Consciousness: awake, alert , oriented and patient cooperative  Airway & Oxygen Therapy: Patient Spontanous Breathing and Patient connected to nasal cannula oxygen  Post-op Assessment: Report given to RN, Post -op Vital signs reviewed and stable and Patient moving all extremities X 4  Post vital signs: Reviewed and stable  Last Vitals:  Filed Vitals:   06/26/15 0607 06/26/15 0918  BP: 135/86 156/90  Pulse: 79 73  Temp: 36.7 C 36.7 C  Resp: 18 20    Complications: No apparent anesthesia complications

## 2015-06-26 NOTE — Op Note (Signed)
06/25/2015 - 06/26/2015  4:46 PM  PATIENT:  Lucy AntiguaElizabeth F Borger  50 y.o. female  PRE-OPERATIVE DIAGNOSIS:  Burst fracture L1, spinal instability  POST-OPERATIVE DIAGNOSIS:  Same  PROCEDURE:  T10-L3 dorsal internal fixation and fusion with pedicle screws bilaterally at T10, T11, T12, L2, and L3; T12-L1 laminectomy for decompression; open reduction of fracture fragments  SURGEON:  Hulan SaasBenjamin J. Kipp Shank, MD  ASSISTANTS: Donalee CitrinGary Cram, MD  ANESTHESIA:   General   DRAINS: Medium Hemovac drain 2   SPECIMEN:  None  INDICATION FOR PROCEDURE: 50 year old woman with L1 burst fracture. She has had intractable pain. She has become bedbound because of pain. We had a long discussion and discussed the treatment alternatives including bedrest, vertebroplasty, L1 corpectomy and fusion, and T10-L3 dorsal internal fixation and fusion. I recommended the above operation. Patient understood the risks, benefits, and alternatives and potential outcomes and wished to proceed.  PROCEDURE DETAILS: After smooth induction of general endotracheal anesthesia the patient was turned prone on an open [place] table. The skin was prepped and draped in usual sterile fashion. Lidocaine with epinephrine was injected into the subcutaneous tissues over the area of the planned incision. The skin was opened in the midline. Soft tissues were dissected to the backs of the spinous processes with monopolar cautery area and subperiosteal dissection was performed along the lamina and into the tips of the transverse processes from T10-L3. Exparel was injected in the muscles on both sides of the incision.  Using fluoroscopic guidance, the high-speed bur, a pedicle probe with electrical conductivity measurement and audible feedback, and a tap pedicle screws were placed bilaterally at each level from T10-L3 skipping L1. There were no breeches detected with the pedicle probe. AP fluoroscopy was then used to confirm lateral to medial trajectory at  each level. I was pleased with the position of all of the screws. I then performed a T12-L1 laminectomy for decompression. I drilled down the left L1 pedicle flush with the posterior edge of the vertebral body. I was able to work a tamp into the ventral epidural space. I encountered fracture fragments. I then reduce these using the tap and a mallet. I was satisfied that there had been good reduction of the fracture fragments.  I irrigated vigorously with bacitracin saline. I decorticated the dorsal surfaces and transverse processes of the exposed vertebrae. A rod was inserted and the tulip pedicle screws on each side and secured with a set screw and then finally tightened. Strips of Infuse were placed only on the exposed bony surfaces with care taken to avoid the exposed thecal sac. Beta tricalcium phosphate strips were then placed on top of this as well as a small amount of the patient's locally harvested autograft bone.  A cross connector was placed between the rods at L1.  A medium Hemovac drain was placed. Approximately 750 mg of vancomycin powder was placed in the subfascial space.  The wound was then closed in routine anatomic layers using a one PDS strata fix suture for the fascia, and 0 PDS strata fix suture for the deep dermal layer, and a running 4-0 Vicryl for the subcuticular layer. The skin was then sealed with Dermabond. The patient was returned to the supine position on the bed and allowed to waken from anesthesia.  PATIENT DISPOSITION:  PACU - hemodynamically stable.   Delay start of Pharmacological VTE agent (>24hrs) due to surgical blood loss or risk of bleeding:  yes

## 2015-06-26 NOTE — Progress Notes (Signed)
Pt off the floor, down to OR.

## 2015-06-26 NOTE — Care Management Note (Signed)
Case Management Note  Patient Details  Name: Alyssa Garner MRN: [id number] Date of Birth: [birth year]  Subjective/Objective:   Pt admitted with fall and burst fracture of lumbar vertebra. She is from home with spouse.               Action/Plan: Plan is for OR today. CM following for d/c needs.   Expected Discharge Date:                  Expected Discharge Plan:     In-House Referral:     Discharge planning Services     Post Acute Care Choice:    Choice offered to:     DME Arranged:    DME Agency:     HH Arranged:    HH Agency:     Status of Service:  In process, will continue to follow  Medicare Important Message Given:    Date Medicare IM Given:    Medicare IM give by:    Date Additional Medicare IM Given:    Additional Medicare Important Message give by:     If discussed at Long Length of Stay Meetings, dates discussed:    Additional Comments:  Kermit BaloKelli F Rowene Suto, RN 06/26/2015, 11:19 AM

## 2015-06-26 NOTE — Progress Notes (Signed)
Pt back from OR

## 2015-06-26 NOTE — Anesthesia Procedure Notes (Signed)
Procedure Name: Intubation Date/Time: 06/26/2015 1:16 PM Performed by: Rosiland OzMEYERS, Emmali Karow Pre-anesthesia Checklist: Patient identified, Timeout performed, Emergency Drugs available, Suction available and Patient being monitored Patient Re-evaluated:Patient Re-evaluated prior to inductionOxygen Delivery Method: Circle system utilized Preoxygenation: Pre-oxygenation with 100% oxygen Intubation Type: IV induction Ventilation: Mask ventilation without difficulty Laryngoscope Size: Glidescope and 3 Grade View: Grade I Tube type: Oral Tube size: 7.0 mm Number of attempts: 1 Airway Equipment and Method: Stylet Placement Confirmation: ETT inserted through vocal cords under direct vision,  positive ETCO2 and breath sounds checked- equal and bilateral Secured at: 22 cm Tube secured with: Tape Dental Injury: Teeth and Oropharynx as per pre-operative assessment

## 2015-06-26 NOTE — Progress Notes (Signed)
   06/26/15 1000  Clinical Encounter Type  Visited With Patient  Visit Type Spiritual support  Referral From Patient  Spiritual Encounters  Spiritual Needs Ritual  Stress Factors  Patient Stress Factors Loss of control;Health changes  Chaplain visited with patient, who expressed some anxiety about her surgery this afternoon. Patient primarily asked to receive communion on Easter Sunday, so chaplain contacted her church to arranged that and to give them an update on her situation.

## 2015-06-26 NOTE — Progress Notes (Signed)
No acute events AVSS Awake and alert Full strength PVR 0 Stable To OR this afternoon

## 2015-06-26 NOTE — Progress Notes (Signed)
Pt requesting morphine. rn called down to neuro OR. Per dr Jean Rosenthaljackson pt is okay to get pain medication now. Will be transported to OR in estimated next 30 minutes.

## 2015-06-26 NOTE — Progress Notes (Signed)
Awake Pain improving Moving legs well Stable To floor when recovered from anesthesia

## 2015-06-26 NOTE — Anesthesia Postprocedure Evaluation (Signed)
Anesthesia Post Note  Patient: Alyssa Garner  Procedure(s) Performed: Procedure(s) (LRB): THORACIC TEN TO LUMBAR THREE INTERNAL FIXATION AND FUSION OPEN REDUCTION OF LUMBAR ONE FRACTURE (No Interbody) (N/A)  Patient location during evaluation: PACU Anesthesia Type: General Level of consciousness: awake and alert Pain management: pain level controlled Vital Signs Assessment: post-procedure vital signs reviewed and stable Respiratory status: spontaneous breathing, nonlabored ventilation, respiratory function stable and patient connected to nasal cannula oxygen Cardiovascular status: blood pressure returned to baseline and stable Postop Assessment: no signs of nausea or vomiting Anesthetic complications: no    Last Vitals:  Filed Vitals:   06/26/15 1715 06/26/15 1730  BP: 155/99 143/98  Pulse: 90   Temp:    Resp: 15 14    Last Pain:  Filed Vitals:   06/26/15 1744  PainSc: 10-Worst pain ever                 Karlei Waldo J

## 2015-06-26 NOTE — Anesthesia Preprocedure Evaluation (Addendum)
Anesthesia Evaluation  Patient identified by MRN, date of birth, ID band Patient awake    Reviewed: Allergy & Precautions, H&P , NPO status , Patient's Chart, lab work & pertinent test results  History of Anesthesia Complications (+) history of anesthetic complications (pain and nausea with first C section)  Airway Mallampati: II  TM Distance: >3 FB Neck ROM: full    Dental no notable dental hx.    Pulmonary neg pulmonary ROS, former smoker,    Pulmonary exam normal breath sounds clear to auscultation       Cardiovascular negative cardio ROS Normal cardiovascular exam Rhythm:regular Rate:Normal     Neuro/Psych Anxiety negative neurological ROS     GI/Hepatic Neg liver ROS, GERD  Medicated,  Endo/Other  Hypothyroidism   Renal/GU negative Renal ROS     Musculoskeletal Low back pain with osteoporosis   Abdominal   Peds  Hematology negative hematology ROS (+)   Anesthesia Other Findings   Reproductive/Obstetrics negative OB ROS                            Anesthesia Physical  Anesthesia Plan  ASA: II  Anesthesia Plan: General   Post-op Pain Management:    Induction: Intravenous  Airway Management Planned: Oral ETT and Video Laryngoscope Planned  Additional Equipment:   Intra-op Plan:   Post-operative Plan: Extubation in OR  Informed Consent: I have reviewed the patients History and Physical, chart, labs and discussed the procedure including the risks, benefits and alternatives for the proposed anesthesia with the patient or authorized representative who has indicated his/her understanding and acceptance.     Plan Discussed with: Anesthesiologist, CRNA and Surgeon  Anesthesia Plan Comments: (GA with ETT, prior DL was not optimal with Regular laryngoscope. Pt had intra-op HTN prior. My need intra-op antihypertensives today.)        Anesthesia Quick Evaluation

## 2015-06-27 ENCOUNTER — Inpatient Hospital Stay (HOSPITAL_COMMUNITY): Payer: BLUE CROSS/BLUE SHIELD

## 2015-06-27 LAB — BASIC METABOLIC PANEL
ANION GAP: 11 (ref 5–15)
BUN: 7 mg/dL (ref 6–20)
CHLORIDE: 99 mmol/L — AB (ref 101–111)
CO2: 24 mmol/L (ref 22–32)
Calcium: 8.2 mg/dL — ABNORMAL LOW (ref 8.9–10.3)
Creatinine, Ser: 0.68 mg/dL (ref 0.44–1.00)
GFR calc Af Amer: >60 mL/min (ref >60)
GFR calc non Af Amer: >60 mL/min (ref >60)
GLUCOSE: 133 mg/dL — AB (ref 65–99)
POTASSIUM: 4.9 mmol/L (ref 3.5–5.1)
Sodium: 134 mmol/L — ABNORMAL LOW (ref 135–145)

## 2015-06-27 LAB — CBC
HEMATOCRIT: 32.4 % — AB (ref 36.0–46.0)
Hemoglobin: 11 g/dL — ABNORMAL LOW (ref 12.0–15.0)
MCH: 31.3 pg (ref 26.0–34.0)
MCHC: 34 g/dL (ref 30.0–36.0)
MCV: 92.3 fL (ref 78.0–100.0)
Platelets: 249 10*3/uL (ref 150–400)
RBC: 3.51 MIL/uL — AB (ref 3.87–5.11)
RDW: 12.4 % (ref 11.5–15.5)
WBC: 9.9 10*3/uL (ref 4.0–10.5)

## 2015-06-27 MED ORDER — MAGNESIUM CITRATE PO SOLN
1.0000 | Freq: Once | ORAL | Status: AC
  Administered 2015-06-27: 1 via ORAL
  Filled 2015-06-27: qty 296

## 2015-06-27 MED ORDER — DIAZEPAM 5 MG PO TABS
5.0000 mg | ORAL_TABLET | Freq: Three times a day (TID) | ORAL | Status: DC | PRN
Start: 2015-06-27 — End: 2015-07-01
  Administered 2015-06-28 – 2015-07-01 (×5): 5 mg via ORAL
  Filled 2015-06-27 (×5): qty 1

## 2015-06-27 MED FILL — Sodium Chloride IV Soln 0.9%: INTRAVENOUS | Qty: 1000 | Status: AC

## 2015-06-27 MED FILL — Heparin Sodium (Porcine) Inj 1000 Unit/ML: INTRAMUSCULAR | Qty: 30 | Status: AC

## 2015-06-27 NOTE — Progress Notes (Signed)
Pt complaining of abdominal pain stating that it felt tender underneath skin, especially in the left upper quadrant. Pt has not been able to have bowel movement after multiple stool softeners and laxitives have been administered. Pt has stated that she has been able to pass some gas but stated that it wasn't very much. Patient also stated that she was able to have a very small bowel movement 2 days ago, but other than that has not had any bowel movement for 6 days. Pt's abdomen also appears distended with echoed bowel sounds. Pt asked to contact MD for gas relief medicine and status update. MD was contacted and updated to patient's condition. RN spoke with Dr. Yetta BarreJones on call for Dr. Bevely Palmeritty. MD instructed to modify patient's diet to clear liquid and order KUB.

## 2015-06-27 NOTE — Progress Notes (Signed)
OT Cancellation Note  Patient Details Name: Alyssa Garner MRN: [id number] DOB: [birth year]   Cancelled Treatment:    Reason Eval/Treat Not Completed: Other (comment) (no brace present in room; notified RN). Will follow up for OT eval as time allows.  Gaye AlkenBailey A Falisa Lamora M.S., OTR/L Pager: [phone]  06/27/2015, 8:45 AM

## 2015-06-27 NOTE — Progress Notes (Signed)
No acute events AVSS Awake and alert Full strength Incision looks good High drain output Stable PT/OT Likely CIR because she was essentially bedbound for a week Keep drain for now

## 2015-06-27 NOTE — Evaluation (Signed)
Physical Therapy Evaluation Patient Details Name: Alyssa Garner MRN: [id number] DOB: 10-28-1965 Today's Date: 06/27/2015   History of Present Illness  Patient is a 50 yo female admitted 06/25/15 after a fall with L1 burst fracture.  Patient now s/p T10-L3 fusion.   PMH:  back pain, anxiety, osteoporosis  Clinical Impression  Patient presents with problems listed below.  Will benefit from acute PT to maximize functional mobility prior to discharge.  Patient able to perform bed mobility with min assist - moving fairly well.  Will address d/c destination at next session once able to ambulate with brace (Ortho Tech called for brace per chart.)    Follow Up Recommendations  (TBD at next session)    Equipment Recommendations  Rolling walker with 5" wheels;3in1 (PT)    Recommendations for Other Services       Precautions / Restrictions Precautions Precautions: Back Precaution Booklet Issued: Yes (comment) Precaution Comments: Reviewed all precautions and back information with patient Required Braces or Orthoses: Spinal Brace Spinal Brace: Thoracolumbosacral orthotic;Lumbar corset;Applied in sitting position (Unsure of type of brace - not yet in room) Restrictions Weight Bearing Restrictions: No      Mobility  Bed Mobility Overal bed mobility: Needs Assistance Bed Mobility: Rolling;Sidelying to Sit;Sit to Sidelying Rolling: Min guard Sidelying to sit: Min assist     Sit to sidelying: Min guard General bed mobility comments: Verbal cues for log rolling and moving sidelying <> sitting.  Patient able to bend opposite LE to assist with rolling - used rail.  Assist to raise trunk to upright sitting position.  Patient able to maintain sitting balance with min guard assist.  Instructed patient on UE placement to return to sidelying - able to complete with min guard assist.  Reviewed positioning in supine and sidelying to maintain back precautions.  Transfers                  General transfer comment: Did not attempt - no brace in room  Ambulation/Gait                Stairs            Wheelchair Mobility    Modified Rankin (Stroke Patients Only)       Balance                                             Pertinent Vitals/Pain Pain Assessment: 0-10 Pain Score: 8  Pain Location: back Pain Descriptors / Indicators: Aching;Sore Pain Intervention(s): Monitored during session;Repositioned;Patient requesting pain meds-RN notified;RN gave pain meds during session    Home Living Family/patient expects to be discharged to:: Private residence Living Arrangements: Spouse/significant other;Children Available Help at Discharge: Family;Available PRN/intermittently Type of Home: House Home Access: Stairs to enter Entrance Stairs-Rails: Right Entrance Stairs-Number of Steps: 2 Home Layout: Two level;Able to live on main level with bedroom/bathroom Home Equipment: None      Prior Function Level of Independence: Independent               Hand Dominance        Extremity/Trunk Assessment   Upper Extremity Assessment: Overall WFL for tasks assessed           Lower Extremity Assessment: Generalized weakness         Communication   Communication: No difficulties  Cognition Arousal/Alertness: Awake/alert Behavior During [facility] for  tasks assessed/performed;Anxious Overall Cognitive Status: Within Functional Limits for tasks assessed                      General Comments      Exercises        Assessment/Plan    PT Assessment Patient needs continued PT services  PT Diagnosis Difficulty walking;Acute pain   PT Problem List Decreased strength;Decreased activity tolerance;Decreased balance;Decreased mobility;Decreased knowledge of use of DME;Decreased knowledge of precautions;Pain  PT Treatment Interventions DME instruction;Gait training;Stair training;Functional mobility  training;Therapeutic activities;Patient/family education   PT Goals (Current goals can be found in the Care Plan section) Acute Rehab PT Goals Patient Stated Goal: To decrease pain PT Goal Formulation: With patient Time For Goal Achievement: 07/04/15 Potential to Achieve Goals: Good    Frequency Min 5X/week   Barriers to discharge Decreased caregiver support      Co-evaluation               End of Session Equipment Utilized During Treatment: Oxygen Activity Tolerance: Patient limited by pain Patient left: in bed;with call bell/phone within reach Nurse Communication: Mobility status         Time: 1610-9604 PT Time Calculation (min) (ACUTE ONLY): 23 min   Charges:   PT Evaluation $PT Eval Moderate Complexity: 1 Procedure PT Treatments $Therapeutic Activity: 8-22 mins   PT G Codes:        Vena Austria 22-Jul-2015, 3:16 PM Durenda Hurt. Renaldo Fiddler, [facility] Pager [phone]

## 2015-06-27 NOTE — Progress Notes (Signed)
Rehab Admissions Coordinator Note:  Patient was screened by Clois DupesBoyette, Starlit Raburn Godwin for appropriateness for an Inpatient Acute Rehab Consult per Dr. Mervyn Gayitty's request.  At this time, I will await therpay evaluations before assisting with rehab venue options.  Clois DupesBoyette, Tamre Cass Godwin 06/27/2015, 9:27 AM  I can be reached at [phone].

## 2015-06-27 NOTE — Progress Notes (Signed)
Orthopedic Tech Progress Note Patient Details:  Alyssa Garner January 13, 1966 [id number]  Patient ID: Alyssa AntiguaElizabeth F Trussell, female   DOB: January 13, 1966, 50 y.o.   MRN: [id number]   Saul FordyceJennifer C Akisha Sturgill 06/27/2015, 11:59 AMCalled [employer] for TLSO brace.

## 2015-06-28 MED ORDER — CELECOXIB 200 MG PO CAPS
200.0000 mg | ORAL_CAPSULE | Freq: Two times a day (BID) | ORAL | Status: DC
  Administered 2015-06-28 – 2015-07-01 (×7): 200 mg via ORAL
  Filled 2015-06-28 (×7): qty 1

## 2015-06-28 MED ORDER — METOCLOPRAMIDE HCL 5 MG/ML IJ SOLN
10.0000 mg | Freq: Four times a day (QID) | INTRAMUSCULAR | Status: AC
  Administered 2015-06-28 – 2015-06-29 (×3): 10 mg via INTRAVENOUS
  Filled 2015-06-28 (×3): qty 2

## 2015-06-28 NOTE — Progress Notes (Signed)
Patient ID: Alyssa Garner, female   DOB: Jan 25, 1966, 50 y.o.   MRN: [id number] Biggest complaint is the feeling of constipation and abdominal bloating and pain. KUB consistent with ileus. Very little flatus. Abdomen slightly distended. Walking well. Dry. Good strength.  Limit narcotics. Try Reglan. Continue to mobilize.

## 2015-06-28 NOTE — Evaluation (Signed)
Occupational Therapy Evaluation Patient Details Name: Alyssa Garner MRN: [id number] DOB: 07/24/1965 Today's Date: 06/28/2015    History of Present Illness Patient is a 50 yo female admitted 06/25/15 after a fall with L1 burst fracture.  Patient now s/p T10-L3 fusion.   PMH:  back pain, anxiety, osteoporosis   Clinical Impression   Pt reports she was independent with ADLs PTA. Currently pt overall min guard for safety with ADLs and functional mobility with the exception of mod assist to don/doff back brace. Began back, safety, and ADL education with pt. Pt only able to recall 1/3 precautions at start of session. Pt planning to d/c home with intermittent supervision from family. Pt would benefit from continued skilled OT to address established goals.    Follow Up Recommendations  No OT follow up;Supervision - Intermittent    Equipment Recommendations  3 in 1 bedside comode;Tub/shower seat    Recommendations for Other Services       Precautions / Restrictions Precautions Precautions: Back Precaution Comments: Pt able to recall 1/3 precautions. Reviewed all precautions with pt. Required Braces or Orthoses: Spinal Brace Spinal Brace: Applied in sitting position;Thoracolumbosacral orthotic Restrictions Weight Bearing Restrictions: No      Mobility Bed Mobility Overal bed mobility: Needs Assistance Bed Mobility: Rolling;Sidelying to Sit Rolling: Supervision Sidelying to sit: Min guard     Sit to sidelying: Supervision;HOB elevated General bed mobility comments: VCs for log roll technique. HOB elevated with use of bed rails.  Transfers Overall transfer level: Needs assistance Equipment used: Rolling walker (2 wheeled) Transfers: Sit to/from Stand Sit to Stand: Min guard         General transfer comment: Min guard for safety. VCs for hand placement and technique.    Balance Overall balance assessment: Needs assistance Sitting-balance support: Feet supported;No  upper extremity supported Sitting balance-Leahy Scale: Good     Standing balance support: No upper extremity supported;During functional activity Standing balance-Leahy Scale: Fair                              ADL Overall ADL's : Needs assistance/impaired Eating/Feeding: Set up;Sitting   Grooming: Standing;Min guard Grooming Details (indicate cue type and reason): Educated pt on use of 2 cups for oral care. Upper Body Bathing: Supervision/ safety;Sitting   Lower Body Bathing: Sit to/from stand;Min guard   Upper Body Dressing : Moderate assistance;Sitting Upper Body Dressing Details (indicate cue type and reason): to don/doff brace Lower Body Dressing: Sit to/from stand;Min guard Lower Body Dressing Details (indicate cue type and reason): Pt able to cross foot over opposite knee. Educated on compensatory strategies for LB ADLs. Toilet Transfer: Ambulation;BSC;RW;Min Pension scheme managerguard Toilet Transfer Details (indicate cue type and reason): Simulated by sit to stand from EOB. Toileting- Clothing Manipulation and Hygiene: Minimal assistance;Sit to/from stand       Functional mobility during ADLs: Rolling walker;Min guard General ADL Comments: Educated on maintaining back precautions during functional activities, keeping frequently used items at counter top height, brace management and wear schedule.     Vision     Perception     Praxis      Pertinent Vitals/Pain Pain Assessment: Faces Faces Pain Scale: Hurts little more Pain Location: back Pain Descriptors / Indicators: Sore Pain Intervention(s): Limited activity within patient's tolerance;Monitored during session;Repositioned     Hand Dominance     Extremity/Trunk Assessment Upper Extremity Assessment Upper Extremity Assessment: Overall WFL for tasks assessed   Lower Extremity Assessment  Lower Extremity Assessment: Defer to PT evaluation   Cervical / Trunk Assessment Cervical / Trunk Assessment: Normal    Communication Communication Communication: No difficulties   Cognition Arousal/Alertness: Awake/alert Behavior During Therapy: WFL for tasks assessed/performed Overall Cognitive Status: Within Functional Limits for tasks assessed       Memory: Decreased recall of precautions             General Comments       Exercises       Shoulder Instructions      Home Living Family/patient expects to be discharged to:: Private residence Living Arrangements: Spouse/significant other;Children Available Help at Discharge: Family;Available PRN/intermittently Type of Home: House Home Access: Stairs to enter [employer] of Steps: 2 Entrance Stairs-Rails: Right Home Layout: Two level;Able to live on main level with bedroom/bathroom     Bathroom Shower/Tub: Tub/shower unit Shower/tub characteristics: Door Bathroom Toilet: Standard     Home Equipment: None          Prior Functioning/Environment Level of Independence: Independent             OT Diagnosis: Generalized weakness;Acute pain   OT Problem List: Decreased strength;Decreased activity tolerance;Impaired balance (sitting and/or standing);Decreased knowledge of use of DME or AE;Decreased knowledge of precautions;Pain   OT Treatment/Interventions: Self-care/ADL training;Energy conservation;DME and/or AE instruction;Therapeutic activities;Patient/family education;Balance training    OT Goals(Current goals can be found in the care plan section) Acute Rehab OT Goals Patient Stated Goal: get back to gardening and cooking OT Goal Formulation: With patient Time For Goal Achievement: 07/12/15 Potential to Achieve Goals: Good ADL Goals Pt Will Perform Grooming: with modified independence;standing Pt Will Transfer to Toilet: with modified independence;ambulating;bedside commode (BSC over toilet) Pt Will Perform Toileting - Clothing Manipulation and hygiene: with modified independence;sit to/from stand (with or  without AE) Pt Will Perform Tub/Shower Transfer: Tub transfer;with supervision;ambulating;shower seat;rolling walker Additional ADL Goal #1: Pt/caregiver will independently don/doff back brace as precursor for ADLs and functional mobility. Additional ADL Goal #2: Pt will independently verbally recall 3/3 back precautions and maintain during ADL.  OT Frequency: Min 2X/week   Barriers to D/C: Decreased caregiver support  intermittent family supervision upon d/c       Co-evaluation              End of Session Equipment Utilized During Treatment: Rolling walker;Back brace  Activity Tolerance: Patient tolerated treatment well Patient left: in chair;with call bell/phone within reach   Time: 1422-1440 OT Time Calculation (min): 18 min Charges:  OT General Charges $OT Visit: 1 Procedure OT Evaluation $OT Eval Moderate Complexity: 1 Procedure G-Codes:     Gaye Alken M.S., OTR/L Pager: [phone]  06/28/2015, 2:59 PM

## 2015-06-28 NOTE — Care Management Note (Signed)
Case Management Note  Patient Details  Name: Alyssa Garner MRN: [id number] Date of Birth: 24-Jun-1965  Subjective/Objective:                    Action/Plan: Patient with probable post op ileus. CM continuing to follow for d/c needs.   Expected Discharge Date:                  Expected Discharge Plan:     In-House Referral:     Discharge planning Services     Post Acute Care Choice:    Choice offered to:     DME Arranged:    DME Agency:     HH Arranged:    HH Agency:     Status of Service:  In process, will continue to follow  Medicare Important Message Given:    Date Medicare IM Given:    Medicare IM give by:    Date Additional Medicare IM Given:    Additional Medicare Important Message give by:     If discussed at Long Length of Stay Meetings, dates discussed:    Additional Comments:  Kermit BaloKelli F Fairley Copher, RN 06/28/2015, 10:40 AM

## 2015-06-28 NOTE — Progress Notes (Signed)
Physical Therapy Treatment Patient Details Name: Alyssa Garner MRN: [id number] DOB: 12-19-1965 Today's Date: 06/28/2015    History of Present Illness Patient is a 50 yo female admitted 06/25/15 after a fall with L1 burst fracture.  Patient now s/p T10-L3 fusion.   PMH:  back pain, anxiety, osteoporosis    PT Comments    Patient initially resistant to attempt OOB and walking due to abdominal distention and not wanting to don brace. Explained mobility will help stimulate her bowels and pt eventually agreeable. Overall, pt did very well and demonstrated good carryover of education during the session (re: use of DME, back precautions). Anticipate good progress and discharge home with no PT follow-up needed.   Follow Up Recommendations  No PT follow up;Supervision - Intermittent     Equipment Recommendations  Rolling walker with 5" wheels;3in1 (PT)    Recommendations for Other Services OT consult     Precautions / Restrictions Precautions Precautions: Back Precaution Comments: Pt only able to state 1/3 precautions; Reviewed all precautions and back information with patient Required Braces or Orthoses: Spinal Brace Spinal Brace: Applied in sitting position;Thoracolumbosacral orthotic Restrictions Weight Bearing Restrictions: No    Mobility  Bed Mobility Overal bed mobility: Needs Assistance Bed Mobility: Rolling;Sidelying to Sit Rolling: Supervision Sidelying to sit: Min guard       General bed mobility comments: pt required vc for technique to log roll; HOB 0, but did use rail; no physical assist to sit +rail  Transfers Overall transfer level: Needs assistance Equipment used: Rolling walker (2 wheeled) Transfers: Sit to/from Stand Sit to Stand: Min assist         General transfer comment: pt required education re: safe use of RW and min assist for balance; x2 (including toileting)  Ambulation/Gait Ambulation/Gait assistance: Min guard Ambulation Distance  (Feet): 220 Feet Assistive device: Rolling walker (2 wheeled) Gait Pattern/deviations: Step-through pattern;Decreased stride length Gait velocity: decr Gait velocity interpretation: Below normal speed for age/gender General Gait Details: excellent posture; education re: use of RW with good carryover; very light use of RW and may try no device   Stairs            Wheelchair Mobility    Modified Rankin (Stroke Patients Only)       Balance                                    Cognition Arousal/Alertness: Awake/alert Behavior During Therapy: WFL for tasks assessed/performed;Anxious Overall Cognitive Status: Within Functional Limits for tasks assessed                      Exercises      General Comments General comments (skin integrity, edema, etc.): Pt reports walking to bathroom with nursing without her brace. Educated that until MD states she may do that, she must wear brace when standing/walking. Educated Psychiatric nurse.      Pertinent Vitals/Pain Pain Assessment: 0-10 Pain Score: 7  Pain Location: back (denies leg pain) Pain Descriptors / Indicators: Operative site guarding Pain Intervention(s): Limited activity within patient's tolerance;Monitored during session;Premedicated before session    Home Living                      Prior Function            PT Goals (current goals can now be found in the care plan section)  Acute Rehab PT Goals Patient Stated Goal: to have a BM Time For Goal Achievement: 07/04/15 Progress towards PT goals: Progressing toward goals    Frequency  Min 5X/week    PT Plan Discharge plan needs to be updated    Co-evaluation             End of Session Equipment Utilized During Treatment: Back brace Activity Tolerance: Patient tolerated treatment well Patient left: with call bell/phone within reach;in chair     Time: [id number] PT Time Calculation (min) (ACUTE ONLY): 24 min  Charges:   $Gait Training: 23-37 mins                    G Codes:      Haydon Kalmar 06/28/2015, 9:37 AM Pager [phone]

## 2015-06-28 NOTE — Progress Notes (Signed)
OT Cancellation Note  Patient Details Name: Alyssa Garner MRN: [name] DOB: 1965/08/16   Cancelled Treatment:    Reason Eval/Treat Not Completed: Other (comment) (visitor present; pt requesing OT check back). Will follow up for OT eval as time allows.  Gaye AlkenBailey A Anamika Kueker M.S., OTR/L Pager: [phone]  06/28/2015, 11:49 AM

## 2015-06-28 NOTE — Progress Notes (Signed)
Pt did well with P.T. Today. No follow up therapy recommended.[phone]

## 2015-06-29 MED ORDER — BISACODYL 10 MG RE SUPP
10.0000 mg | Freq: Once | RECTAL | Status: AC
  Administered 2015-06-29: 10 mg via RECTAL
  Filled 2015-06-29: qty 1

## 2015-06-29 MED ORDER — FLEET ENEMA 7-19 GM/118ML RE ENEM
1.0000 | ENEMA | Freq: Once | RECTAL | Status: DC
  Filled 2015-06-29: qty 1

## 2015-06-29 NOTE — Progress Notes (Signed)
When rn came back in to give pt synthroid medication, pt was laying in bed with eyes closed resting, rn woke pt up to assess pt and give medications. Pt then requesting more pain medication. Pain 6/10. rn again explained about the concern of ileus and that did not want this to progress to bowel obstruction. rn assessed pt and then pt immediately rolled over, closed eyes and started resting again. Laying calmly in bed, no facial grimacing or clenched hands or legs, pt appeared relaxed and resting.

## 2015-06-29 NOTE — Progress Notes (Signed)
Pt asked when she could have oxycodone again, rn explained that the doctor did not want to have prn medications due to ileus, pt reports she has not had a bowel movement since 4/11. rn explained that once pt has a bowel movement she can have more prn meds.   Pt requesting gas medicine

## 2015-06-29 NOTE — Progress Notes (Signed)
rn walked into pts room to give her muscle relaxer, pt sleeping with eyes closed, did not wake up when nurse entered or logged onto computer. Will not wake up pt at this time. Will give medication later when pt is awake.

## 2015-06-29 NOTE — Progress Notes (Signed)
Visual discomfort. No radicular pain. Patient sitting up in bed looks comfortable.  Awake and alert. Oriented and appropriate. Motor and sensory function stable. Abdomen soft. Wound clean and dry.  Progressing well. Continue efforts at mobilization. Continue efforts at pain control. Nearing probable discharge.

## 2015-06-29 NOTE — Progress Notes (Addendum)
Pt in camera room. Secretary saw pt getting on hands and knees on floor. Rn went into room to check on pt and asked pt if she had fallen or why she got on the floor. Pt reported she did not fall, but was bending down to get a book. Pt in not acute distress. Alert and oriented x4, stable. Requesting pain meds.   rn discussed with pt that doctors wanted pt to not take as many prn pain medications due to possible ileus, rn said she would check on if pt could receive something for pain, but that it was wise to stick to the scheduled pain medications.

## 2015-06-29 NOTE — Progress Notes (Addendum)
As nurse is listing pts med and telling her which pain meds she is getting, pt asks if she is still allowed to have IV morphine. rn informed her no. rn checked the order has been discontinued. Pt falling asleep while rn scanning all the medications. At this time pain level 6/10

## 2015-06-29 NOTE — Progress Notes (Signed)
Pt has order to shower, pt finished showering. Both honey comb dressings wet. Will change dressings. Pt reports pain "6/10 and is shooting up". Pt asking if IV can be taken out, rn explained that the IV should stay in until the pt is discharged.

## 2015-06-29 NOTE — Progress Notes (Addendum)
rn gave pt 1 prn medicine, pt asking if she could have morphine. rn informed pt that doctor did not reorder morphine. (when doctor assessed pt earlier, pt asked for morphine). Pt asking when she could have pain medicine next. rn informed pt she had schedule pain medication at 1400.   Did not give scheduled 1200 tylenol dose, would have been over the 4,000mg  limit

## 2015-06-29 NOTE — Progress Notes (Signed)
Pt given water to drink, encouraged to drink lots of water and to ambulate on unit to help have bowel movement.  Pt walked in halls of unit with brace and walker.

## 2015-06-29 NOTE — Progress Notes (Signed)
Physical Therapy Treatment Patient Details Name: Alyssa Garner MRN: [id number] DOB: 23-Jun-1965 Today's Date: 06/29/2015    History of Present Illness Patient is a 50 y.o. female admitted 06/25/15 after a fall with L1 burst fracture.  Patient now s/p T10-L3 fusion.   PMH:  back pain, anxiety, osteoporosis    PT Comments    Pt progressing towards physical therapy goals. Appears anxious regarding progressing functional mobility and although did well walking without RW, asked to keep using the walker because she "needs it". Was not able to elaborate. Pt continually trying to take brace off throughout session and was reminded on need to wear brace at all times OOB unless specified by the MD. Will continue to follow.  Follow Up Recommendations  No PT follow up;Supervision - Intermittent     Equipment Recommendations  Rolling walker with 5" wheels;3in1 (PT)    Recommendations for Other Services OT consult     Precautions / Restrictions Precautions Precautions: Back;Fall Precaution Booklet Issued: No Precaution Comments: Pt was cued for back precautions during functional mobility.  Required Braces or Orthoses: Spinal Brace Spinal Brace: Applied in sitting position;Thoracolumbosacral orthotic (Adjusted in standing) Restrictions Weight Bearing Restrictions: No    Mobility  Bed Mobility Overal bed mobility: Needs Assistance Bed Mobility: Rolling;Sidelying to Sit;Sit to Sidelying Rolling: Supervision (Mod I-supervision) Sidelying to sit: Supervision     Sit to sidelying: Supervision General bed mobility comments: Pt sitting up in recliner upon PT arrival.   Transfers Overall transfer level: Needs assistance Equipment used: Rolling walker (2 wheeled) Transfers: Sit to/from Stand Sit to Stand: Supervision         General transfer comment: Pt was able to power-up to full standing position without assistance. VC's for hand placement on seated surface for safety and to  maintain precautions with transitions.   Ambulation/Gait Ambulation/Gait assistance: Min guard Ambulation Distance (Feet): 220 Feet Assistive device: Rolling walker (2 wheeled) Gait Pattern/deviations: Step-through pattern;Decreased stride length;Trunk flexed Gait velocity: Decreased Gait velocity interpretation: Below normal speed for age/gender General Gait Details: VC's for improved posture as pt rounding in the shoulders and maintaining a slightly bent posture in the back. Attempted without RW for ~50 feet and pt reports this increased her pain, although no unsteadiness or LOB was noted.    Stairs            Wheelchair Mobility    Modified Rankin (Stroke Patients Only)       Balance Overall balance assessment: Needs assistance Sitting-balance support: Feet supported;No upper extremity supported Sitting balance-Leahy Scale: Good     Standing balance support: No upper extremity supported;During functional activity Standing balance-Leahy Scale: Fair                      Cognition Arousal/Alertness: Lethargic Behavior During Therapy: WFL for tasks assessed/performed Overall Cognitive Status: No family/caregiver present to determine baseline cognitive functioning       Memory: Decreased short-term memory (Slow processing)              Exercises      General Comments        Pertinent Vitals/Pain Pain Assessment: Faces Pain Score: 5  Faces Pain Scale: Hurts little more Pain Location: Incision  Pain Descriptors / Indicators: Operative site guarding;Sore Pain Intervention(s): Limited activity within patient's tolerance;Monitored during session;Repositioned    Home Living                      Prior Function  PT Goals (current goals can now be found in the care plan section) Acute Rehab PT Goals Patient Stated Goal: go home PT Goal Formulation: With patient Time For Goal Achievement: 07/04/15 Progress towards PT goals:  Progressing toward goals    Frequency  Min 5X/week    PT Plan Current plan remains appropriate    Co-evaluation             End of Session Equipment Utilized During Treatment: Back brace Activity Tolerance: Patient tolerated treatment well Patient left: with call bell/phone within reach;in chair     Time: 0454-0981 PT Time Calculation (min) (ACUTE ONLY): 22 min  Charges:  $Gait Training: 8-22 mins                    G Codes:      Conni Slipper Jul 16, 2015, 12:38 PM   Conni Slipper, PT, DPT Acute Rehabilitation Services Pager: [phone]

## 2015-06-29 NOTE — Progress Notes (Signed)
Occupational Therapy Treatment Patient Details Name: Alyssa Garner MRN: [id number] DOB: [birth year] Today's Date: 06/29/2015    History of present illness Patient is a 50 y.o. female admitted 06/25/15 after a fall with L1 burst fracture.  Patient now s/p T10-L3 fusion.   PMH:  back pain, anxiety, osteoporosis   OT comments  Pt progressing. Education provided in session. Plan to see pt again to reinforce safety and back precautions.   Follow Up Recommendations  No OT follow up;Supervision/Assistance - 24 hour    Equipment Recommendations  3 in 1 bedside comode;Tub/shower seat    Recommendations for Other Services      Precautions / Restrictions Precautions Precautions: Back;Fall Precaution Booklet Issued: No Precaution Comments: educated on back precautions; cues in session Required Braces or Orthoses: Spinal Brace Spinal Brace: Applied in sitting position;Thoracolumbosacral orthotic Restrictions Weight Bearing Restrictions: No       Mobility Bed Mobility Overal bed mobility: Needs Assistance Bed Mobility: Rolling;Sidelying to Sit;Sit to Sidelying Rolling: Supervision (Mod I-supervision) Sidelying to sit: Supervision     Sit to sidelying: Supervision    Transfers Overall transfer level: Needs assistance   Transfers: Sit to/from Stand Sit to Stand: Supervision (and set up for RW)         General transfer comment: RW in front of pt    Balance      Min assist for simulated tub transfer. Used RW for ambulation in room with no LOB.                             ADL Overall ADL's : Needs assistance/impaired     Grooming: Wash/dry face;Oral care;Set up;Supervision/safety;Standing           Upper Body Dressing : Moderate assistance;Sitting Upper Body Dressing Details (indicate cue type and reason): back brace   Lower Body Dressing Details (indicate cue type and reason): pt able to adjust socks sitting EOB without physical assist Toilet  Transfer: Min guard;Ambulation;RW (3 in 1 over commode)   Toileting- Clothing Manipulation and Hygiene: Sit to/from stand;Set up;Supervision/safety   Tub/ Shower Transfer: Tub transfer;Minimal assistance;Ambulation (used RW to ambulate close to simulated tub)   Functional mobility during ADLs: Rolling walker (Min guard for ambulation with RW; Min assist-tub transfer) General ADL Comments: Discussed incorporating precautions into functional activities. Educated on what pt could use for toilet aid (hot dog tongs/grill tongs/wipe). Educated on back brace. Recommended someone be with her for tub transfer and bathing.  Discussed DME.      Vision                     Perception     Praxis      Cognition  lethargic Behavior During Therapy: WFL for tasks assessed/performed Overall Cognitive Status: No family/caregiver present to determine baseline cognitive functioning (decreased memory and slow processing)       Memory: Decreased recall of precautions;Decreased short-term memory               Extremity/Trunk Assessment               Exercises     Shoulder Instructions       General Comments      Pertinent Vitals/ Pain       Pain Assessment: 0-10 Pain Score: 5  Pain Location: back Pain Descriptors / Indicators: Dull Pain Intervention(s): Monitored during session;Repositioned  Home Living  Prior Functioning/Environment              Frequency Min 2X/week     Progress Toward Goals  OT Goals(current goals can now be found in the care plan section)  Progress towards OT goals: Progressing toward goals  Acute Rehab OT Goals Patient Stated Goal: go home OT Goal Formulation: With patient Time For Goal Achievement: 07/12/15 Potential to Achieve Goals: Good ADL Goals Pt Will Perform Grooming: with modified independence;standing Pt Will Transfer to Toilet: with modified  independence;ambulating;bedside commode (BSC over toilet) Pt Will Perform Toileting - Clothing Manipulation and hygiene: with modified independence;sit to/from stand (with or without AE) Pt Will Perform Tub/Shower Transfer: Tub transfer;with supervision;ambulating;shower seat;rolling walker Additional ADL Goal #1: Pt/caregiver will independently don/doff back brace as precursor for ADLs and functional mobility. Additional ADL Goal #2: Pt will independently verbally recall 3/3 back precautions and maintain during ADL.  Plan Discharge plan needs to be updated    Co-evaluation                 End of Session Equipment Utilized During Treatment: Rolling walker;Back brace;Gait belt   Activity Tolerance Patient tolerated treatment well   Patient Left in bed;with call bell/phone within reach;with bed alarm set   Nurse Communication          Time: 1610-9604 OT Time Calculation (min): 19 min  Charges: OT General Charges $OT Visit: 1 Procedure OT Treatments $Self Care/Home Management : 8-22 mins  Earlie Raveling OTR/L [phone] 06/29/2015, 9:18 AM

## 2015-06-30 MED ORDER — PANTOPRAZOLE SODIUM 40 MG PO TBEC
40.0000 mg | DELAYED_RELEASE_TABLET | Freq: Every day | ORAL | Status: DC
  Administered 2015-06-30 – 2015-07-01 (×2): 40 mg via ORAL
  Filled 2015-06-30 (×2): qty 1

## 2015-06-30 MED ORDER — FLEET ENEMA 7-19 GM/118ML RE ENEM
1.0000 | ENEMA | Freq: Every day | RECTAL | Status: DC | PRN
  Administered 2015-06-30: 1 via RECTAL
  Filled 2015-06-30: qty 1

## 2015-06-30 NOTE — Progress Notes (Signed)
Pt sitting in chair with legs crossed, pt has been reminded multiple times not to cross legs. Pt reports she knows she is not suppose to cross her legs. Pt continues to still cross her legs.

## 2015-06-30 NOTE — Progress Notes (Signed)
Md paged and made aware of pts left facial droop with rest. Md not concerned at this time. No orders placed.

## 2015-06-30 NOTE — Progress Notes (Signed)
Patient found walking in the hallway without a brace and using the walker. RN walked her back to her room, put the brace on and took the walker from her. She was re-educated on the importance of wearing her brace when she is walking and why she is not to be using the walker. Pt stated she knew but she was in a lot of pain right now. Pt does not show signs of pain at this time but was told when her next pain medications were due. She continued to walk in the the hallway for another five minutes. Continuing to monitor closely. Navia Lindahl, [place] ScrapeSarah E, RN

## 2015-06-30 NOTE — Progress Notes (Signed)
Pt was instructed to alert rn and show rn if she has a bowel movement. Pt called rn because "she had had a bowel movement".   Pt has passed a walnut size white material that looked to be like suppository material and not actually stool. rn informed pt this did not count as a bowel movement.

## 2015-06-30 NOTE — Progress Notes (Signed)
Pt asking for prn pain medication, pt reminded she is not allowed to have prn pain medication until she has a bowel movement. Pt asking for xanax. rn informed pt she could get xanax next at 2005.  Husband has brought prune juice for pt to drink. rn encouaged pt to keep drinking lots of water and to continue walking.

## 2015-06-30 NOTE — Progress Notes (Addendum)
After rn told pt she was not allowed to go off the unit, pt and husband were standing near the windows and elevators. rn then noticed pt and husband where no where to be seen. rn checked unit, pt not in room or on unit, unless she is in a waiting room or bathroom. Will wait a few min and check and see if pt back in room.

## 2015-06-30 NOTE — Progress Notes (Signed)
Patient claiming that she had a bowel movement this evening but it was just watery and not much stool at all. All night she has been asking for more pain medications even after being educated on her medication regimen. Pt told that she cannot have more PRN's at this time. She is wanting to speak with the rounding MD today to talk about pain control. Continuing to monitor quietly. Idriss Quackenbush, [place] ScrapeSarah E RN

## 2015-06-30 NOTE — Progress Notes (Signed)
Occupational Therapy Treatment Patient Details Name: Alyssa Garner MRN: [id number] DOB: November 28, 1965 Today's Date: 06/30/2015    History of present illness Patient is a 50 y.o. female admitted 06/25/15 after a fall with L1 burst fracture.  Patient now s/p T10-L3 fusion.   PMH:  back pain, anxiety, osteoporosis   OT comments  Education provided in session and OT signing off.  Follow Up Recommendations  No OT follow up;Supervision/Assistance - 24 hour    Equipment Recommendations  3 in 1 bedside comode;Tub/shower seat    Recommendations for Other Services      Precautions / Restrictions Precautions Precautions: Back;Fall Precaution Booklet Issued: Yes (comment) Precaution Comments: unable to remember all of her back precautions; reviewed with pt Required Braces or Orthoses: Spinal Brace Spinal Brace: Thoracolumbosacral orthotic (pt was fastening side standing when OT arrived) Restrictions Weight Bearing Restrictions: No       Mobility Bed Mobility               General bed mobility comments: not assessed  Transfers Overall transfer level: Needs assistance   Transfers: Sit to/from Stand Sit to Stand: Supervision              Balance    Supervision for ambulation.                               ADL Overall ADL's : Needs assistance/impaired                 Upper Body Dressing : Minimal assistance;Sitting Upper Body Dressing Details (indicate cue type and reason): back brace; had pt sit and OT assisted in helping with back brace Lower Body Dressing: Supervision/safety;Set up;With adaptive equipment;Sitting/lateral leans   Toilet Transfer: Supervision/safety;Ambulation (sit to stand from chair)           Functional mobility during ADLs: Supervision/safety General ADL Comments: Educated on back brace. Discussed incorporating precautions into functional activities. Educated on LB ADL technique and AE. Pt practiced with reacher and  sock aid. Reviewed what she could use for toilet aid.  Reviewed tub transfer technique. Educated on safety. Discussed sitting time.      Vision                     Perception     Praxis      Cognition  Awake/Alert Behavior During Therapy: WFL for tasks assessed/performed Overall Cognitive Status: No family/caregiver present to determine baseline cognitive functioning       Memory: Decreased recall of precautions;Decreased short-term memory               Extremity/Trunk Assessment               Exercises     Shoulder Instructions       General Comments      Pertinent Vitals/ Pain       Pain Assessment: 0-10 Pain Score: 7  (pushing an 8) Pain Location: back Pain Descriptors / Indicators: Aching Pain Intervention(s): Monitored during session;Repositioned  Home Living                                          Prior Functioning/Environment              Frequency Min 2X/week     Progress Toward Goals  OT Goals(current goals  can now be found in the care plan section)  Progress towards OT goals: Progressing toward goals (adequate for d/c)  Acute Rehab OT Goals Patient Stated Goal: not stated OT Goal Formulation: With patient Time For Goal Achievement: 07/12/15 Potential to Achieve Goals: Good ADL Goals Pt Will Perform Grooming: with modified independence;standing Pt Will Transfer to Toilet: with modified independence;ambulating;bedside commode (BSC over toilet) Pt Will Perform Toileting - Clothing Manipulation and hygiene: with modified independence;sit to/from stand (with or without AE) Pt Will Perform Tub/Shower Transfer: Tub transfer;with supervision;ambulating;shower seat;rolling walker Additional ADL Goal #1: Pt/caregiver will independently don/doff back brace as precursor for ADLs and functional mobility. Additional ADL Goal #2: Pt will independently verbally recall 3/3 back precautions and maintain during ADL.  Plan  Discharge plan remains appropriate    Co-evaluation                 End of Session Equipment Utilized During Treatment: Back brace;Other (comment) (AE)   Activity Tolerance Patient tolerated treatment well   Patient Left in chair;with call bell/phone within reach;with family/visitor present   Nurse Communication Mobility status        Time: 1478-29561014-1031 OT Time Calculation (min): 17 min  Charges: OT General Charges $OT Visit: 1 Procedure OT Treatments $Self Care/Home Management : 8-22 mins  Earlie RavelingStraub, Nastasia Kage L OTR/L [phone] 06/30/2015, 11:01 AM

## 2015-06-30 NOTE — Progress Notes (Signed)
Physical Therapy Treatment Patient Details Name: Alyssa Garner MRN: [id number] DOB: [birth year] Today's Date: 06/30/2015    History of Present Illness Patient is a 50 y.o. female admitted 06/25/15 after a fall with L1 burst fracture.  Patient now s/p T10-L3 fusion.   PMH:  back pain, anxiety, osteoporosis    PT Comments    Patient seen for mobility progression. Patient mobilizing well without use of assistive device. Ambulated increased distance performed stair negotiation and verbalized precautions. Patient was educated on car transfers, mobility expectations and safety, no further acute PT needs, Will sign off.  OF NOTE: slight facial droop noted with conversation, nsg aware.    Follow Up Recommendations  No PT follow up;Supervision - Intermittent     Equipment Recommendations  Rolling walker with 5" wheels;3in1 (PT)    Recommendations for Other Services       Precautions / Restrictions Precautions Precautions: Back;Fall Precaution Booklet Issued: Yes (comment) Precaution Comments: unable to remember all of her back precautions; reviewed with pt Required Braces or Orthoses: Spinal Brace Spinal Brace: Thoracolumbosacral orthotic (pt was fastening side standing when OT arrived) Restrictions Weight Bearing Restrictions: No    Mobility  Bed Mobility               General bed mobility comments: not assessed  Transfers Overall transfer level: Needs assistance   Transfers: Sit to/from Stand Sit to Stand: Independent         General transfer comment: no physical assist or cues required  Ambulation/Gait Ambulation/Gait assistance: Supervision Ambulation Distance (Feet): 450 Feet Assistive device: None Gait Pattern/deviations: Step-through pattern;Decreased stride length;Narrow base of support Gait velocity: decreased Gait velocity interpretation: Below normal speed for age/gender General Gait Details: VCs for increased cadence, no physical assist required  no assistive device used   Stairs Stairs: Yes Stairs assistance: Supervision Stair Management: One rail Right;Step to pattern;Forwards Number of Stairs: 4 General stair comments: able to perform step to technique without assist  Wheelchair Mobility    Modified Rankin (Stroke Patients Only)       Balance     Sitting balance-Leahy Scale: Good     Standing balance support: No upper extremity supported Standing balance-Leahy Scale: Fair                      Cognition Arousal/Alertness: Awake/alert Behavior During Therapy: WFL for tasks assessed/performed Overall Cognitive Status: No family/caregiver present to determine baseline cognitive functioning       Memory: Decreased recall of precautions;Decreased short-term memory              Exercises      General Comments General comments (skin integrity, edema, etc.): educated on car transfers, mobility expectations and safety, no further acute PT needs      Pertinent Vitals/Pain Pain Assessment: 0-10 Pain Score: 6  Pain Location: back Pain Descriptors / Indicators: Aching Pain Intervention(s): Monitored during session    Home Living                      Prior Function            PT Goals (current goals can now be found in the care plan section) Acute Rehab PT Goals Patient Stated Goal: not stated PT Goal Formulation: With patient Time For Goal Achievement: 07/04/15 Potential to Achieve Goals: Good Progress towards PT goals: Goals met/education completed, patient discharged from PT    Frequency       PT Plan  (  Goals met, d/c PT)    Co-evaluation             End of Session Equipment Utilized During Treatment: Back brace Activity Tolerance: Patient tolerated treatment well Patient left: with call bell/phone within reach;in chair     Time: 4660-5637 PT Time Calculation (min) (ACUTE ONLY): 16 min  Charges:  $Gait Training: 8-22 mins                    G CodesDuncan Dull Jul 27, 2015, 2:38 PM Alben Deeds, [place] DPT  [phone]

## 2015-06-30 NOTE — Progress Notes (Signed)
Pt asked if she was allowed to go off the unit, rn confirmed and told pt she was not allowed to go off the unit.

## 2015-06-30 NOTE — Progress Notes (Signed)
Pt sometimes forgets back precaution instructions. Will twist, and rn must remind pt not to do that. Yesterday 4/15 pt went walking once without brace on. rn informed pt she could have brace on at all times when ambulating.

## 2015-06-30 NOTE — Progress Notes (Signed)
Pt in camera room, pt back in bed. Pt was in bathroom. Asking for enema.

## 2015-06-30 NOTE — Progress Notes (Signed)
Still having trouble with postoperative constipation. Patient still with some flatus. Complains of back pain is difficult to control but does not exhibit pain behavior. Motor and sensory function stable. Wound clean and dry. Afebrile. Vitals are stable.  Progressing slowly. Continue efforts at mobilization and bowel care. Probable discharge early this week.

## 2015-06-30 NOTE — Progress Notes (Addendum)
PT mentioned to nurse that pt may have left facial droop. rn assessed, pt with mouth at rest occasionally has left facial droop, resolves with smile,smile symmetrical.  Pt reports she has been asked in the past if she has bells palsy and has been told she has a "crooked smile".  Neuro intact with no deficits noted at this time. Able to raise eyebrows, stick out tongue, move tongue side to side, and lick upper lip.

## 2015-07-01 ENCOUNTER — Inpatient Hospital Stay (HOSPITAL_COMMUNITY): Payer: BLUE CROSS/BLUE SHIELD

## 2015-07-01 DIAGNOSIS — F332 Major depressive disorder, recurrent severe without psychotic features: Secondary | ICD-10-CM

## 2015-07-01 DIAGNOSIS — F4323 Adjustment disorder with mixed anxiety and depressed mood: Secondary | ICD-10-CM | POA: Clinically undetermined

## 2015-07-01 MED ORDER — GABAPENTIN 400 MG PO CAPS
400.0000 mg | ORAL_CAPSULE | Freq: Three times a day (TID) | ORAL | Status: DC
  Administered 2015-07-01 – 2015-07-02 (×4): 400 mg via ORAL
  Filled 2015-07-01 (×4): qty 1

## 2015-07-01 MED ORDER — DIAZEPAM 5 MG PO TABS: 5.0000 mg | ORAL_TABLET | Freq: Three times a day (TID) | ORAL | Status: DC | PRN

## 2015-07-01 MED ORDER — OXYCODONE HCL ER 20 MG PO T12A
20.0000 mg | EXTENDED_RELEASE_TABLET | Freq: Two times a day (BID) | ORAL | Status: DC
Start: 2015-07-01 — End: 2015-07-02
  Administered 2015-07-01: 20 mg via ORAL
  Filled 2015-07-01: qty 1

## 2015-07-01 MED ORDER — ACETAMINOPHEN 500 MG PO TABS: 1000.0000 mg | ORAL_TABLET | Freq: Four times a day (QID) | ORAL | Status: DC

## 2015-07-01 MED ORDER — METHYLNALTREXONE BROMIDE 12 MG/0.6ML ~~LOC~~ SOLN: 12.0000 mg | SUBCUTANEOUS | Status: DC

## 2015-07-01 MED ORDER — GABAPENTIN 400 MG PO CAPS: 400.0000 mg | ORAL_CAPSULE | Freq: Three times a day (TID) | ORAL | Status: DC

## 2015-07-01 MED ORDER — SENNA 8.6 MG PO TABS: 1.0000 | ORAL_TABLET | Freq: Two times a day (BID) | ORAL | Status: DC

## 2015-07-01 MED ORDER — BISACODYL 5 MG PO TBEC: 10.0000 mg | DELAYED_RELEASE_TABLET | Freq: Every day | ORAL | Status: DC | PRN

## 2015-07-01 MED ORDER — MORPHINE SULFATE (PF) 2 MG/ML IV SOLN
2.0000 mg | Freq: Once | INTRAVENOUS | Status: AC
  Administered 2015-07-01: 2 mg via INTRAVENOUS
  Filled 2015-07-01: qty 1

## 2015-07-01 MED ORDER — DIAZEPAM 5 MG PO TABS
5.0000 mg | ORAL_TABLET | Freq: Three times a day (TID) | ORAL | Status: DC
  Administered 2015-07-01 – 2015-07-02 (×3): 5 mg via ORAL
  Filled 2015-07-01 (×3): qty 1

## 2015-07-01 MED ORDER — OXYCODONE HCL 5 MG PO TABS: 5.0000 mg | ORAL_TABLET | ORAL | Status: DC | PRN

## 2015-07-01 MED ORDER — OXYCODONE HCL ER 15 MG PO T12A: 30.0000 mg | EXTENDED_RELEASE_TABLET | Freq: Two times a day (BID) | ORAL | Status: DC

## 2015-07-01 MED ORDER — DOCUSATE SODIUM 100 MG PO CAPS: 100.0000 mg | ORAL_CAPSULE | Freq: Two times a day (BID) | ORAL | Status: DC

## 2015-07-01 MED ORDER — METHOCARBAMOL 750 MG PO TABS: 750.0000 mg | ORAL_TABLET | Freq: Four times a day (QID) | ORAL | Status: DC

## 2015-07-01 MED ORDER — OXYCODONE HCL ER 20 MG PO T12A: 20.0000 mg | EXTENDED_RELEASE_TABLET | Freq: Two times a day (BID) | ORAL | Status: DC

## 2015-07-01 NOTE — Progress Notes (Signed)
IV team attempted IV twice, unsuccessful.  Pt asking if they can put and IV in her neck. Asking IV team to attempt another IV. IV team explained policy. Pt can be put back on IV team list, but it may be several hours because pt is not priority.

## 2015-07-01 NOTE — Progress Notes (Signed)
Psychiatry at bedside.

## 2015-07-01 NOTE — Progress Notes (Signed)
rn called and made md Ditty aware of results. While rn waiting for md on phone, pt had large bowel movement.  md made aware. Plan to discharge pt tomorrow.

## 2015-07-01 NOTE — Progress Notes (Signed)
Pt given ice pack per request. 

## 2015-07-01 NOTE — Progress Notes (Signed)
md at bedside  IV team at bedside

## 2015-07-01 NOTE — Progress Notes (Signed)
Sisters at bedside, concerned about when pts IV last night was removed. Rn explained that staff did not remove her IV. Rn even called last nights rn and confirmed that rn did not take out pts IV.   rn asked if sisters were going to transport pt home tomorrow. One sister answered that both sisters were unable tomorrow to take pt home. pts husband is now out of town. rn encouraged pt to work on finding a ride home.   rn spoke with md Ditty who says that pts discharge will be ready by 9-10am. Order for PT to see pt tomorrow morning and walk pt, because pt now stating she is weak and needs assistance walking to and from the bathroom.  Pt requesting a list of all her procedures done while in the hospital. rn explained that she could not give that to her, but that if she wanted her medical records she would have to contact the office of medical records. rn also explained that pt could sign up for my chart and see all test and lab results.

## 2015-07-01 NOTE — Progress Notes (Signed)
Spoke with md, if second IV team unsuccessful. Then pt can get IM dose of morphine. Verbal order for IV team to attempt IV in feet.

## 2015-07-01 NOTE — Progress Notes (Signed)
Patient complaining of abdominal pain and distension Also complaining of back pain Had a small bowel movement yesterday AVSS Awake and alert Full strength Will get CT abd/pelvis May get methylnaltrexone depending on what that shows

## 2015-07-01 NOTE — Progress Notes (Addendum)
Pt asked for rn to attempt IV again. rn attempted IV unsuccessful. Asking when IV team will be here.  Pt asking if rn can put and IV in her neck or feet.

## 2015-07-01 NOTE — Consult Note (Signed)
[facility] Face-to-Face Psychiatry Consult   Reason for Consult:  Agitation and status post surgery - lumbar vertebra Referring Physician:  Dr. Bevely Palmer Patient Identification: Alyssa Garner MRN:  [id number] Principal Diagnosis: Adjustment disorder with mixed anxiety and depressed mood Diagnosis:   Patient Active Problem List   Diagnosis Date Noted  . Adjustment disorder with mixed anxiety and depressed mood [F43.23] 07/01/2015  . Burst fracture of lumbar vertebra (HCC) [S32.001A] 06/25/2015  . Contusion of lower back [S30.0XXA] 06/21/2015  . Fracture of lumbar spine (HCC) [S32.009A] 06/21/2015  . Hyponatremia [E87.1] 05/14/2015  . Postmenopausal bleeding [N95.0] 11/29/2014  . S/P Davinci total laparoscopic hysterectomy with bilateral salpingectomy [Z90.710] 11/29/2014  . Sinus disorder [J34.9] 04/18/2014  . Allergic rhinitis, cause unspecified [J30.9] 11/23/2011  . Cervical lymphadenitis [I88.9] 11/23/2011  . Disturbance of skin sensation [R20.9] 06/10/2010  . Routine general medical examination at a health care facility [Z00.00] 06/10/2010  . OTHER ACNE [L70.8] 09/22/2009  . LYMPHADENOPATHY [R59.9] 06/13/2009  . Hypothyroidism [E03.9] 04/09/2008  . LOW BACK PAIN [M54.5] 12/13/2006    Total Time spent with patient: 45 minutes  Subjective:   Alyssa Garner is a 50 y.o. female patient admitted with agitation and status post surgery.  HPI:  Alyssa Garner is a 50 years old married female, seen, chart reviewed for the face-to-face psychiatric consultation and evaluation of increased symptoms of depression, agitation. Patient appeared lying on her bed, depressed, irritable, tearful, upset, frustrated and angry because of the events happened for the last 2 weeks. Patient is complaining about back pain 10 out of 10, constipation and pain in her bottom. Patient reported her husband left on business trip today and will be back only on Friday. Patient stated she fell down while coming down  on steps at home 2 weeks ago, went to [facility] long emergency department who were provided 6 tablets of pain medication and sent her home without further investigation saying it is muscle sprain. Patient was examined by primary care physician and then sent to the orthopedic surgeon who identified with burst fracture lumbar vertebra and required surgical intervention. Patient continued to have uncontrollable emotional problems since admitted to hospital and status post surgery. Spoke with the staff nurse who reported patient pain medication need to be stopped because of 6 days of constipation which was finally relieved today. Patient also seems to be high needy frequently seeking more and more pain medication instead of having constipation. Patient denied suicidal/homicidal ideation, intention or plans and also has no evidence of psychosis. Patient is willing to take psychiatric medication and also adjustment suggested.  Past Psychiatric History: Patient was previously diagnosed with major depressive disorder and has been seeing outpatient psychiatrist Dr. Chrissie Noa at [facility] and has been taking medication. Patient denied acute psychiatric hospitalization.  Risk to Self: Is patient at risk for suicide?: No Risk to Others:   Prior Inpatient Therapy:   Prior Outpatient Therapy:    Past Medical History:  Past Medical History  Diagnosis Date  . Long-term current use of steroids   . HYPOTHYROIDISM 04/09/2008  . LOW BACK PAIN 12/13/2006  . Anxiety   . Osteoporosis     no treatment  . ETOH abuse   . Complication of anesthesia 1997    pt states with her first CS she had horrible pain and felt bad; "like it didn't kick in"  . Heart murmur   . GERD (gastroesophageal reflux disease)     occ OTC  . Arthritis     "neck" (06/25/2015)  .  Depression   . Infection, kidney 1997    "hospitalized for 1 wk"    Past Surgical History  Procedure Laterality Date  . Cesarean section  1998; 2000; 2007  .  Augmentation mammaplasty Bilateral ~ 1989; 2012  . Abdominal hysterectomy  10/2014    "left my ovaries"  . Tubal ligation  2007   Family History:  Family History  Problem Relation Age of Onset  . Adopted: Yes  . Family history unknown: Yes   Family Psychiatric  History: unknown. Social History:  History  Alcohol Use  . Yes    Comment: 06/25/2015 "stopped 06/19/2015"     History  Drug Use No    Social History   Social History  . Marital Status: Married    Spouse Name: N/A  . Number of Children: 3  . Years of Education: N/A   Occupational History  .  [employer]   Social History Main Topics  . Smoking status: Former Smoker -- 0.25 packs/day for 10 years    Types: Cigarettes  . Smokeless tobacco: Never Used     Comment: "quit smoking cigarettes in ~ 1990"  . Alcohol Use: Yes     Comment: 06/25/2015 "stopped 06/19/2015"  . Drug Use: No  . Sexual Activity: Yes    Birth Control/ Protection: Surgical   Other Topics Concern  . None   Social History Narrative   Additional Social History:    Allergies:   Allergies  Allergen Reactions  . Trazodone And Nefazodone     nightmares    Labs: No results found for this or any previous visit (from the past 48 hour(s)).  Current Facility-Administered Medications  Medication Dose Route Frequency Provider Last Rate Last Dose  . 0.9 %  sodium chloride infusion   Intravenous Continuous Loura Halt Ditty, MD 100 mL/hr at 06/26/15 2000 1,000 mL at 06/26/15 2000  . 0.9 %  sodium chloride infusion  250 mL Intravenous Continuous Truitt Merle, MD   Stopped at 06/26/15 2016  . acetaminophen (TYLENOL) tablet 1,000 mg  1,000 mg Oral 4 times per day Truitt Merle, MD   1,000 mg at 07/01/15 1215  . ALPRAZolam Prudy Feeler) tablet 0.5 mg  0.5 mg Oral TID PRN Loura Halt Ditty, MD   0.5 mg at 06/30/15 2220  . bisacodyl (DULCOLAX) EC tablet 10 mg  10 mg Oral Daily PRN Loura Halt Ditty, MD   10 mg at 06/30/15 1605  .  celecoxib (CELEBREX) capsule 200 mg  200 mg Oral BID Tia Alert, MD   200 mg at 07/01/15 1214  . chlordiazePOXIDE (LIBRIUM) capsule 10 mg  10 mg Oral BID Loura Halt Ditty, MD   10 mg at 07/01/15 1214  . diazepam (VALIUM) tablet 5 mg  5 mg Oral Q8H PRN Loura Halt Ditty, MD   5 mg at 07/01/15 0108  . docusate sodium (COLACE) capsule 100 mg  100 mg Oral BID Loura Halt Ditty, MD   100 mg at 07/01/15 1216  . FLUoxetine (PROZAC) capsule 80 mg  80 mg Oral Daily Loura Halt Ditty, MD   80 mg at 07/01/15 1215  . gabapentin (NEURONTIN) capsule 200 mg  200 mg Oral TID Loura Halt Ditty, MD   200 mg at 07/01/15 1216  . levothyroxine (SYNTHROID, LEVOTHROID) tablet 175 mcg  175 mcg Oral QAC breakfast Loura Halt Ditty, MD   175 mcg at 07/01/15 0751  . Melatonin TABS 3 mg  3 mg Oral QHS PRN  Loura Halt Ditty, MD      . menthol-cetylpyridinium (CEPACOL) lozenge 3 mg  1 lozenge Oral PRN Loura Halt Ditty, MD       Or  . phenol (CHLORASEPTIC) mouth spray 1 spray  1 spray Mouth/Throat PRN Loura Halt Ditty, MD      . methocarbamol (ROBAXIN) tablet 750 mg  750 mg Oral QID Loura Halt Ditty, MD   750 mg at 07/01/15 1216  . morphine 2 MG/ML injection 2 mg  2 mg Intravenous Once Loura Halt Ditty, MD      . multivitamin with minerals tablet 1 tablet  1 tablet Oral Daily Loura Halt Ditty, MD   1 tablet at 07/01/15 1215  . ondansetron (ZOFRAN) injection 4 mg  4 mg Intravenous Q4H PRN Loura Halt Ditty, MD   4 mg at 06/26/15 1609  . ondansetron (ZOFRAN-ODT) disintegrating tablet 4 mg  4 mg Oral Q6H PRN Loura Halt Ditty, MD      . oxyCODONE (Oxy IR/ROXICODONE) immediate release tablet 5 mg  5 mg Oral Q3H PRN Loura Halt Ditty, MD   5 mg at 06/30/15 0510  . oxyCODONE (OXYCONTIN) 12 hr tablet 20 mg  20 mg Oral Q12H Loura Halt Ditty, MD      . pantoprazole (PROTONIX) EC tablet 40 mg  40 mg Oral QHS Donalee Citrin, MD   40 mg at 06/30/15 2104  . senna (SENOKOT) tablet 8.6 mg   1 tablet Oral BID Loura Halt Ditty, MD   8.6 mg at 07/01/15 1214  . sodium chloride flush (NS) 0.9 % injection 3 mL  3 mL Intravenous Q12H Loura Halt Ditty, MD   3 mL at 06/30/15 1000  . sodium chloride flush (NS) 0.9 % injection 3 mL  3 mL Intravenous PRN Loura Halt Ditty, MD      . sodium chloride flush (NS) 0.9 % injection 3 mL  3 mL Intravenous Q12H Loura Halt Ditty, MD   3 mL at 06/30/15 2111  . sodium chloride flush (NS) 0.9 % injection 3 mL  3 mL Intravenous PRN Loura Halt Ditty, MD      . sodium phosphate (FLEET) 7-19 GM/118ML enema 1 enema  1 enema Rectal Once Julio Sicks, MD   1 enema at 06/29/15 2200  . sodium phosphate (FLEET) 7-19 GM/118ML enema 1 enema  1 enema Rectal Daily PRN Julio Sicks, MD   1 enema at 06/30/15 1816  . zolpidem (AMBIEN) tablet 5 mg  5 mg Oral QHS PRN Loura Halt Ditty, MD   5 mg at 06/29/15 2207    Musculoskeletal: Strength & Muscle Tone: decreased Gait & Station: unable to stand Patient leans: N/A  Psychiatric Specialty Exam: ROS complaining about extreme back pain, depression, anxiety and stomach pain secondary to constipation No Fever-chills, No Headache, No changes with Vision or hearing, reports vertigo No problems swallowing food or Liquids, No Chest pain, Cough or Shortness of Breath, No Abdominal pain, No Nausea or Vommitting, Bowel movements are regular, No Blood in stool or Urine, No dysuria, No new skin rashes or bruises, No new joints pains-aches,  No new weakness, tingling, numbness in any extremity, No recent weight gain or loss, No polyuria, polydypsia or polyphagia,   A full 10 point Review of Systems was done, except as stated above, all other Review of Systems were negative.  Blood pressure 155/88, pulse 96, temperature 99.1 F (37.3 C), temperature source Oral, resp. rate 19, height 5\' 10"  (1.778 m), SpO2 100 %.There is no weight  on file to calculate BMI.  General Appearance: Guarded  Eye Contact::  Good   Speech:  Clear and Coherent and Slow  Volume:  Decreased  Mood:  Anxious, Dysphoric and Irritable  Affect:  Depressed and Tearful  Thought Process:  Coherent and Goal Directed  Orientation:  Full (Time, Place, and Person)  Thought Content:  Rumination  Suicidal Thoughts:  No  Homicidal Thoughts:  No  Memory:  Immediate;   Fair Recent;   Fair  Judgement:  Impaired  Insight:  Fair  Psychomotor Activity:  Decreased  Concentration:  Good  Recall:  Good  Fund of Knowledge:Good  Language: Good  Akathisia:  Negative  Handed:  Right  AIMS (if indicated):     Assets:  Communication Skills Desire for Improvement Financial Resources/Insurance Housing Intimacy Leisure Time Resilience Social Support Talents/Skills Transportation Vocational/Educational  ADL's:  Impaired  Cognition: WNL  Sleep:      Treatment Plan Summary: Daily contact with patient to assess and evaluate symptoms and progress in treatment and Medication management  Discontinue Librium -not helpful Continue fluoxetine 80 mg daily for depression- will discuss with patient regarding switching to Cymbalta which may be more beneficial due to current pain syndrome. Increase diazepam 5 mg 3 times daily for anxiety  Increase gabapentin 400 mg 3 times daily for chronic pain Appreciate psychiatric consultation and follow up as clinically required Please contact [phone] or [phone] if needs further assistance  Disposition: Patient does not meet criteria for psychiatric inpatient admission. Supportive therapy provided about ongoing stressors.  Nehemiah SettleJONNALAGADDA,JANARDHAHA R., MD 07/01/2015 1:24 PM
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
## 2015-07-01 NOTE — Progress Notes (Signed)
Rn spoke with Dr Bevely Palmeritty. Will give IV pain medication, but per Dr Bevely Palmeritty, educate pt that opioid pain medication may make constipation worse.   Transport here to take pt to CT. Will administer morphine after verified and pt back from CT

## 2015-07-01 NOTE — Progress Notes (Signed)
md paged, pt requesting IV pain meds because pt is NPO now until CT scan results.  Pt also requesting to see the doctor again, wants to know the "contingency plan".

## 2015-07-01 NOTE — Progress Notes (Signed)
pts 2 sisters are at bedside

## 2015-07-01 NOTE — Progress Notes (Addendum)
Pt had bowel movement 4/11, admitted 4/12, on 4/13 had fleets enema and mag citrate, also had abd xray which showed ileus. Since then pt has been receiving multiple medications for constipation. No bowel movement for several days. Pt had fleets enema yesterday 4/16 around 1815. Pt did have brown discharge in toilet, rn counted this as a bowel movement, but instructed pt rn needed to see next bowel movement as well.  Night rn in report today stated pts abd was slightly more disteneded  Upon rn assessment today pt reported that around 2230 last night 4/15 pt started having abd pain and flatus stopped.  At present abdomen distended, tender, no flatus.   Md Ditty made aware. Orders placed. Will page md with results.  Pt will be NPO until results and further orders from Md Ditty.   Pt did have synthroid medicine this morning with sips of water.

## 2015-07-01 NOTE — Progress Notes (Signed)
IV team at bedside 

## 2015-07-01 NOTE — Progress Notes (Addendum)
Rn went in to attmept IV. Pt has multiple bruise site, pt has had at least 3 IVs since 4/12.  Rn looked on both arms, noted one possible site for IV, rn asked pt if she wanted rn to attempt IV or to page IV team. Rn explained that if rn could not get IV on first try, then IV team would be paged.  Pt said "do whatever".   Rn attempted IV, unsuccessful. Will page IV team.   Pt also now using the walker, PT yesterday signed pt off and did not want her to use the walker because it would encourage bad habits. rn explained that pt did not want pt to use the walker, pt said she had to because she "was weak". rn asked about pt being weak and when weakness started. Pt said "I guess since I was on the toilet for 3 hours". rn said that pt was not on the toilet for 3 hours. Pt rolled her eyes and said "I've been weak since my bowel movement".  rn left walker by bedside  Pt had another large bowel movement.

## 2015-07-02 MED ORDER — OXYCODONE HCL ER 15 MG PO T12A
30.0000 mg | EXTENDED_RELEASE_TABLET | Freq: Two times a day (BID) | ORAL | Status: DC
  Administered 2015-07-02: 30 mg via ORAL
  Filled 2015-07-02: qty 2

## 2015-07-02 MED ORDER — OXYCODONE HCL ER 30 MG PO T12A: 30.0000 mg | EXTENDED_RELEASE_TABLET | Freq: Two times a day (BID) | ORAL | Status: DC

## 2015-07-02 MED ORDER — OXYCODONE HCL 5 MG PO TABS
10.0000 mg | ORAL_TABLET | ORAL | Status: DC | PRN
  Administered 2015-07-02 (×2): 10 mg via ORAL
  Filled 2015-07-02 (×2): qty 2

## 2015-07-02 MED ORDER — OXYCODONE HCL 5 MG PO TABS: 5.0000 mg | ORAL_TABLET | ORAL | Status: DC | PRN

## 2015-07-02 NOTE — Progress Notes (Signed)
Pt discharging at this time with family taking all personal belongings. Surgical dressing site, clean and dry with no drainage or signs of infection. Brace on and aligned. IV discontinued, dry dressing applied. Discharge instructions provided with prescriptions with verbal understanding. No noted distress. Pt made aware to follow up with MD per discharge summary.

## 2015-07-02 NOTE — Progress Notes (Signed)
Pt refused Robaxin scheduled for 1400 pm.

## 2015-07-02 NOTE — Care Management Note (Signed)
Case Management Note  Patient Details  Name: Alyssa Garner MRN: [id number] Date of Birth: [birth year]  Subjective/Objective:                    Action/Plan: Plan is for patient to discharge home with self care. PT recommending a shower chair. CM spoke with the pt and she would like to purchase this at another venue. No further needs per CM.   Expected Discharge Date:                  Expected Discharge Plan:  Home/Self Care  In-House Referral:     Discharge planning Services     Post Acute Care Choice:    Choice offered to:     DME Arranged:    DME Agency:     HH Arranged:    HH Agency:     Status of Service:  Completed, signed off  Medicare Important Message Given:    Date Medicare IM Given:    Medicare IM give by:    Date Additional Medicare IM Given:    Additional Medicare Important Message give by:     If discussed at Long Length of Stay Meetings, dates discussed:    Additional Comments:  Kermit BaloKelli F Tadashi Burkel, RN 07/02/2015, 11:52 AM

## 2015-07-02 NOTE — Progress Notes (Signed)
Pt requesting pain medication after receiving pain medication at 7:33 am. Pt requesting Valium which is not due until 10am. Requesting for this nurse to notify Md stated, " I want my Valium right now and I want doctor Ditty to come back to let me know what he is discharging me on."  Pt also stated that she thinks that she would be better if she goes home today to sleep in her own bed instead of tomorrow. Pt noted at this time ambulating with therapist in the hallway with brace on and aligned. No noted distress. Md paged per pt request.

## 2015-07-02 NOTE — Evaluation (Signed)
Physical Therapy Evaluation Patient Details Name: Alyssa Garner MRN: [id number] DOB: [birth year] Today's Date: 07/02/2015   History of Present Illness  Patient is a 50 y.o. female admitted 06/25/15 after a fall with L1 burst fracture.  Patient now s/p T10-L3 fusion.   PMH:  back pain, anxiety, osteoporosis  Clinical Impression  PT was asked to see pt even though we recently d/c'd her from PT because she reported feeling much weaker since all her issues with her bowels yesterday. Pt functioning at supervision level and has deferred all professional recommendations despite c/o "i feel weak." pt inconsistent with presentation vs chief complaint. Strongly recommend 24/7 assist as unsure of patient cognitive stability and pt's reported weakness.    Follow Up Recommendations No PT follow up;Supervision/Assistance - 24 hour    Equipment Recommendations   (shower chair, pt deferred cane and RW)    Recommendations for Other Services       Precautions / Restrictions Precautions Precautions: Back;Fall Precaution Booklet Issued: Yes (comment) Precaution Comments: unable to remember all of her back precautions; reviewed with pt Required Braces or Orthoses: Spinal Brace Spinal Brace: Thoracolumbosacral orthotic (brace was on but pt reports "i need someone to strap me in") Restrictions Weight Bearing Restrictions: No      Mobility  Bed Mobility               General bed mobility comments: pt up walking in hallway upon PT arrival  Transfers Overall transfer level: Needs assistance Equipment used: None Transfers: Sit to/from Stand Sit to Stand: Modified independent (Device/Increase time)         General transfer comment: pt sat in chair without arm rests, v/c's to push up from chair multiple times however pt pushed up on own thighs to complete stand x 3 trisl  Ambulation/Gait Ambulation/Gait assistance: Supervision Ambulation Distance (Feet): 300 Feet Assistive device:  None Gait Pattern/deviations: Step-through pattern (decreased step height) Gait velocity: decreased Gait velocity interpretation: Below normal speed for age/gender General Gait Details: pt repeatedly stated "i feel weak, especially when I stop walking. I think I need a cane." trialed a cane however pt just carried it despite max v/c's for proper sequencing of cane. pt then stated "i guess I don't need the cane because it's only when I stop walking that I feel weak. I think it's best if I just have someone walk with me when I go outside." Pt with no tremors or shakiness, no episodes of LOB or instability.   Stairs Stairs: Yes Stairs assistance: Supervision Stair Management: No rails Number of Stairs: 2 General stair comments: attempted to edu on how to use SPC on stairs since pt doesnt have a handrail on her 2 STE however pt not comprehending and despite maximal v/c's pt reaching for rail. Pt reports "i'll have him put one up" when asked if he can do it before she went home she said "probably" but then she also says her husband is on a trip right now.   Wheelchair Mobility    Modified Rankin (Stroke Patients Only)       Balance Overall balance assessment: Needs assistance Sitting-balance support: Feet supported Sitting balance-Leahy Scale: Good     Standing balance support: No upper extremity supported Standing balance-Leahy Scale: Fair                               Pertinent Vitals/Pain Pain Assessment: 0-10 Pain Score: 3  Pain Location: back  Pain Descriptors / Indicators: Aching Pain Intervention(s): Monitored during session    Home Living Family/patient expects to be discharged to:: Private residence Living Arrangements: Spouse/significant other;Children Available Help at Discharge: Family;Available PRN/intermittently Type of Home: House Home Access: Stairs to enter Entrance Stairs-Rails: Right Entrance Stairs-Number of Steps: 2 Home Layout: Two level;Able  to live on main level with bedroom/bathroom Home Equipment: None      Prior Function Level of Independence: Independent               Hand Dominance        Extremity/Trunk Assessment   Upper Extremity Assessment: Overall WFL for tasks assessed           Lower Extremity Assessment: Generalized weakness      Cervical / Trunk Assessment:  (recent surgery)  Communication   Communication: No difficulties  Cognition Arousal/Alertness: Awake/alert Behavior During Therapy: WFL for tasks assessed/performed Overall Cognitive Status: No family/caregiver present to determine baseline cognitive functioning       Memory: Decreased recall of precautions;Decreased short-term memory              General Comments      Exercises        Assessment/Plan    PT Assessment Patient needs continued PT services  PT Diagnosis Difficulty walking;Acute pain   PT Problem List Decreased strength;Decreased activity tolerance;Decreased balance;Decreased mobility;Decreased knowledge of use of DME;Decreased knowledge of precautions;Pain  PT Treatment Interventions DME instruction;Gait training;Stair training;Functional mobility training;Therapeutic activities;Patient/family education   PT Goals (Current goals can be found in the Care Plan section) Acute Rehab PT Goals Patient Stated Goal: go home today PT Goal Formulation: With patient Time For Goal Achievement: 07/09/15 Potential to Achieve Goals: Good    Frequency Min 3X/week   Barriers to discharge Decreased caregiver support pt to call friends to stay with her while husband is on a trip and daughter works    Co-evaluation               End of Session Equipment Utilized During Treatment: Back brace Activity Tolerance: Patient tolerated treatment well Patient left: with call bell/phone within reach;in chair Nurse Communication: Mobility status;Precautions         Time: [id number] PT Time Calculation (min)  (ACUTE ONLY): 25 min   Charges:   PT Evaluation $PT Eval Low Complexity: 1 Procedure PT Treatments $Gait Training: 8-22 mins   PT G CodesMarcene Brawn:        Chae Shuster Marie 07/02/2015, 9:12 AM

## 2015-07-02 NOTE — Discharge Summary (Signed)
Date of Admission: 06/25/15  Date of Discharge: 07/02/15  PRE-OPERATIVE DIAGNOSIS: Burst fracture L1, spinal instability  POST-OPERATIVE DIAGNOSIS: Same  PROCEDURE: T10-L3 dorsal internal fixation and fusion with pedicle screws bilaterally at T10, T11, T12, L2, and L3; T12-L1 laminectomy for decompression; open reduction of fracture fragments  [facility]  Hospital Course:  The patient was admitted with the above diagnosis and taken for the above operation on the day after admission.  Her post-operative course was complicated by an ileus and pain control problems.  This has resolved and she is discharged in stable medical and neurological condition  Discharged Medications: Oxycontin, IR oxycodone, scheduled tylenol, robaxin, neurontin  Follow up: 2-3 weeks

## 2015-07-02 NOTE — Progress Notes (Signed)
Pt states that the pain medication ordered was effective. Pt stated, " I feel really good, almost loopy." Pt noted at this time resting in bed. Call bell within reach. Will continue to monitor.

## 2015-07-02 NOTE — Progress Notes (Signed)
No acute events Patient had two bowel movements yesterday She is tearful about her experience yesterday AVSS Awake and alert Oriented Moving legs well Incision c/d/i Neuro stable Reviewed images with patient to show her what her hardware looks like on her CT scan and to show her that the pressure has been relieved from her spine and that her alignment is improved We will work on pain control More PT today Hopefully home tomorrow

## 2015-07-09 ENCOUNTER — Encounter (HOSPITAL_COMMUNITY): Payer: Self-pay | Admitting: Neurological Surgery

## 2015-07-10 ENCOUNTER — Encounter: Payer: Self-pay | Admitting: Endocrinology

## 2015-07-10 ENCOUNTER — Ambulatory Visit (INDEPENDENT_AMBULATORY_CARE_PROVIDER_SITE_OTHER): Payer: BLUE CROSS/BLUE SHIELD | Admitting: Endocrinology

## 2015-07-10 VITALS — BP 120/82 | HR 100 | Temp 98.7°F | Wt 162.0 lb

## 2015-07-10 DIAGNOSIS — S32019A Unspecified fracture of first lumbar vertebra, initial encounter for closed fracture: Secondary | ICD-10-CM | POA: Diagnosis not present

## 2015-07-10 MED ORDER — IBUPROFEN 600 MG PO TABS: 600.0000 mg | ORAL_TABLET | Freq: Three times a day (TID) | ORAL | Status: AC | PRN

## 2015-07-10 MED ORDER — LACTULOSE 20 GM/30ML PO SOLN: 30.0000 mL | Freq: Three times a day (TID) | ORAL | Status: DC

## 2015-07-10 NOTE — Progress Notes (Signed)
Subjective:    Patient ID: Alyssa Garner, female    DOB: 08/09/1965, 50 y.o.   MRN: [id number]  HPI  The state of at least three ongoing medical problems is addressed today, with interval history of each noted here: Spinal fracture: since surgery, pt says she is making progress in ambulation.   Back pain: she requires percocet approx 9 tabs per day.   Insomnia: this persists despite xanax 2-3/d.   Past Medical History  Diagnosis Date  . Long-term current use of steroids   . HYPOTHYROIDISM 04/09/2008  . LOW BACK PAIN 12/13/2006  . Anxiety   . Osteoporosis     no treatment  . ETOH abuse   . Complication of anesthesia 1997    pt states with her first CS she had horrible pain and felt bad; "like it didn't kick in"  . Heart murmur   . GERD (gastroesophageal reflux disease)     occ OTC  . Arthritis     "neck" (06/25/2015)  . Depression   . Infection, kidney 1997    "hospitalized for 1 wk"    Past Surgical History  Procedure Laterality Date  . Cesarean section  1998; 2000; 2007  . Augmentation mammaplasty Bilateral ~ 1989; 2012  . Abdominal hysterectomy  10/2014    "left my ovaries"  . Tubal ligation  2007    Social History   Social History  . Marital Status: Married    Spouse Name: N/A  . Number of Children: 3  . Years of Education: N/A   Occupational History  .  [employer]   Social History Main Topics  . Smoking status: Former Smoker -- 0.25 packs/day for 10 years    Types: Cigarettes  . Smokeless tobacco: Never Used     Comment: "quit smoking cigarettes in ~ 1990"  . Alcohol Use: Yes     Comment: 06/25/2015 "stopped 06/19/2015"  . Drug Use: No  . Sexual Activity: Yes    Birth Control/ Protection: Surgical   Other Topics Concern  . Not on file   Social History Narrative    Current Outpatient Prescriptions on File Prior to Visit  Medication Sig Dispense Refill  . acetaminophen (TYLENOL) 500 MG tablet Take 2 tablets (1,000 mg total) by mouth  every 6 (six) hours. 56 tablet 0  . ALPRAZolam (XANAX) 0.5 MG tablet Take 1 tablet (0.5 mg total) by mouth 3 (three) times daily. 90 tablet 5  . bisacodyl (DULCOLAX) 5 MG EC tablet Take 2 tablets (10 mg total) by mouth daily as needed for moderate constipation. 30 tablet 0  . docusate sodium (COLACE) 100 MG capsule Take 1 capsule (100 mg total) by mouth 2 (two) times daily. 60 capsule 2  . FLUoxetine (PROZAC) 40 MG capsule Take 2 capsules (80 mg total) by mouth daily. 180 capsule 3  . gabapentin (NEURONTIN) 400 MG capsule Take 1 capsule (400 mg total) by mouth 3 (three) times daily. 90 capsule 2  . HYDROcodone-acetaminophen (NORCO/VICODIN) 5-325 MG tablet Take 2 tablets by mouth every 4 (four) hours as needed. 30 tablet 0  . levothyroxine (SYNTHROID, LEVOTHROID) 175 MCG tablet Take 1 tablet (175 mcg total) by mouth daily before breakfast. 90 tablet 3  . methocarbamol (ROBAXIN) 750 MG tablet Take 1 tablet (750 mg total) by mouth 4 (four) times daily. 120 tablet 2  . Probiotic Product (PROBIOTIC DAILY PO) Take 1 capsule by mouth daily. Reported on 07/10/2015    . senna (SENOKOT) 8.6 MG  TABS tablet Take 1 tablet (8.6 mg total) by mouth 2 (two) times daily. 120 each 0  . Simethicone (GAS-X PO) Take 2 tablets by mouth daily as needed (gas). Reported on 07/10/2015    . Melatonin 5 MG TABS Take 5 mg by mouth at bedtime as needed (sleep). Reported on 07/10/2015    . Multiple Vitamin (MULTIVITAMIN WITH MINERALS) TABS tablet Take 1 tablet by mouth daily. Reported on 07/10/2015    . ondansetron (ZOFRAN-ODT) 4 MG disintegrating tablet Take 1 tablet (4 mg total) by mouth every 6 (six) hours as needed for nausea or vomiting. (Patient not taking: Reported on 07/10/2015) 30 tablet 1  . oxyCODONE (OXY IR/ROXICODONE) 5 MG immediate release tablet Take 1-2 tablets (5-10 mg total) by mouth every 3 (three) hours as needed for breakthrough pain. (Patient not taking: Reported on 07/10/2015) 90 tablet 0  . oxyCODONE (OXYCONTIN)  30 MG 12 hr tablet Take 30 mg by mouth every 12 (twelve) hours. (Patient not taking: Reported on 07/10/2015) 14 each 0  . zolpidem (AMBIEN) 10 MG tablet Take 1 tablet (10 mg total) by mouth at bedtime as needed for sleep. (Patient taking differently: Take 10 mg by mouth at bedtime as needed for sleep. Pt stated the med is working well for her.) 90 tablet 1   No current facility-administered medications on file prior to visit.    Allergies  Allergen Reactions  . Trazodone And Nefazodone     nightmares    Family History  Problem Relation Age of Onset  . Adopted: Yes  . Family history unknown: Yes    BP 120/82 mmHg  Pulse 100  Temp(Src) 98.7 F (37.1 C) (Oral)  Wt 162 lb (73.483 kg)  SpO2 98%  LMP  (LMP Unknown)  Review of Systems Denies falls.  Constipation is improving slowly.      Objective:   Physical Exam VITAL SIGNS:  See vs page GENERAL: no distress.   Back: long midline healing surgical scar Chest wall: has brace on. Gait: slow but steady.       Assessment & Plan:  Spinal fracture: good functional progress Constipation, due to pain rx Back pain: there is much less progress with this than there is functionally. Insomnia: exac by surgery.   Patient is advised the following: Patient Instructions  Please see 2 specialists.  you will receive a phone call, about days and times for appointments.   Try taking xanax at bedtime, to minimize ambien.   i have sent a prescription to your pharmacy, for the bowels, and for the ibuprofen.

## 2015-07-10 NOTE — Patient Instructions (Addendum)
Please see 2 specialists.  you will receive a phone call, about days and times for appointments.   Try taking xanax at bedtime, to minimize ambien.   i have sent a prescription to your pharmacy, for the bowels, and for the ibuprofen.

## 2015-07-24 ENCOUNTER — Ambulatory Visit (INDEPENDENT_AMBULATORY_CARE_PROVIDER_SITE_OTHER)
Admission: RE | Admit: 2015-07-24 | Discharge: 2015-07-24 | Disposition: A | Discharge status: Home / Self Care | Payer: BLUE CROSS/BLUE SHIELD | Source: Ambulatory Visit | Attending: Endocrinology | Admitting: Endocrinology

## 2015-07-24 DIAGNOSIS — S32019A Unspecified fracture of first lumbar vertebra, initial encounter for closed fracture: Secondary | ICD-10-CM | POA: Diagnosis not present

## 2015-11-12 ENCOUNTER — Ambulatory Visit (INDEPENDENT_AMBULATORY_CARE_PROVIDER_SITE_OTHER): Payer: BLUE CROSS/BLUE SHIELD | Admitting: Endocrinology

## 2015-11-12 VITALS — BP 138/80 | HR 74 | Wt 163.0 lb

## 2015-11-12 DIAGNOSIS — E039 Hypothyroidism, unspecified: Secondary | ICD-10-CM

## 2015-11-12 LAB — TSH: TSH: 34.73 u[IU]/mL — ABNORMAL HIGH (ref 0.35–4.50)

## 2015-11-12 MED ORDER — ZOLPIDEM TARTRATE 10 MG PO TABS: 10.0000 mg | ORAL_TABLET | Freq: Every evening | ORAL | 1 refills | Status: DC | PRN

## 2015-11-12 MED ORDER — FLUOXETINE HCL 40 MG PO CAPS: 80.0000 mg | ORAL_CAPSULE | Freq: Every day | ORAL | 3 refills | Status: DC

## 2015-11-12 MED ORDER — ALPRAZOLAM 0.5 MG PO TABS: 0.5000 mg | ORAL_TABLET | Freq: Two times a day (BID) | ORAL | 5 refills | Status: DC | PRN

## 2015-11-12 MED ORDER — LEVOTHYROXINE SODIUM 200 MCG PO TABS: 200.0000 ug | ORAL_TABLET | Freq: Every day | ORAL | 2 refills | Status: DC

## 2015-11-12 NOTE — Progress Notes (Signed)
Subjective:    Patient ID: Alyssa Garner, female    DOB: Dec 28, 1965, 50 y.o.   MRN: [id number]  HPI Back pain: she says this is much better.  She says she rarely needs pain medication.  She does not take gabapentin.   Anxiety: pt says well-controlled, but she says she still requires Xanax TID.   Insomnia: she says she still requires Ambien QHS.   Hypothyroidism: she has gained a few lbs.   Past Medical History:  Diagnosis Date  . Anxiety   . Arthritis    "neck" (06/25/2015)  . Complication of anesthesia 1997   pt states with her first CS she had horrible pain and felt bad; "like it didn't kick in"  . Depression   . ETOH abuse   . GERD (gastroesophageal reflux disease)    occ OTC  . Heart murmur   . HYPOTHYROIDISM 04/09/2008  . Infection, kidney 1997   "hospitalized for 1 wk"  . Long-term current use of steroids   . LOW BACK PAIN 12/13/2006  . Osteoporosis    no treatment    Past Surgical History:  Procedure Laterality Date  . ABDOMINAL HYSTERECTOMY  10/2014   "left my ovaries"  . AUGMENTATION MAMMAPLASTY Bilateral ~ 1989; 2012  . CESAREAN SECTION  1998; 2000; 2007  . TUBAL LIGATION  2007    Social History   Social History  . Marital status: Married    Spouse name: N/A  . Number of children: 3  . Years of education: N/A   Occupational History  .  [employer]   Social History Main Topics  . Smoking status: Former Smoker    Packs/day: 0.25    Years: 10.00    Types: Cigarettes  . Smokeless tobacco: Never Used     Comment: "quit smoking cigarettes in ~ 1990"  . Alcohol use Yes     Comment: 06/25/2015 "stopped 06/19/2015"  . Drug use: No  . Sexual activity: Yes    Birth control/ protection: Surgical   Other Topics Concern  . Not on file   Social History Narrative  . No narrative on file    Current Outpatient Prescriptions on File Prior to Visit  Medication Sig Dispense Refill  . ibuprofen (ADVIL,MOTRIN) 600 MG tablet Take 1 tablet (600 mg  total) by mouth every 8 (eight) hours as needed. 90 tablet 11  . Melatonin 5 MG TABS Take 5 mg by mouth at bedtime as needed (sleep). Reported on 07/10/2015    . Multiple Vitamin (MULTIVITAMIN WITH MINERALS) TABS tablet Take 1 tablet by mouth daily. Reported on 07/10/2015    . Probiotic Product (PROBIOTIC DAILY PO) Take 1 capsule by mouth daily. Reported on 07/10/2015    . acetaminophen (TYLENOL) 500 MG tablet Take 2 tablets (1,000 mg total) by mouth every 6 (six) hours. (Patient not taking: Reported on 11/12/2015) 56 tablet 0  . bisacodyl (DULCOLAX) 5 MG EC tablet Take 2 tablets (10 mg total) by mouth daily as needed for moderate constipation. (Patient not taking: Reported on 11/12/2015) 30 tablet 0  . docusate sodium (COLACE) 100 MG capsule Take 1 capsule (100 mg total) by mouth 2 (two) times daily. (Patient not taking: Reported on 11/12/2015) 60 capsule 2  . Lactulose 20 GM/30ML SOLN Take 30 mLs (20 g total) by mouth 3 (three) times daily. (Patient not taking: Reported on 11/12/2015) 946 mL 11  . methocarbamol (ROBAXIN) 750 MG tablet Take 1 tablet (750 mg total) by mouth 4 (four)  times daily. (Patient not taking: Reported on 11/12/2015) 120 tablet 2  . senna (SENOKOT) 8.6 MG TABS tablet Take 1 tablet (8.6 mg total) by mouth 2 (two) times daily. (Patient not taking: Reported on 11/12/2015) 120 each 0  . Simethicone (GAS-X PO) Take 2 tablets by mouth daily as needed (gas). Reported on 07/10/2015     No current facility-administered medications on file prior to visit.     Allergies  Allergen Reactions  . Trazodone And Nefazodone     nightmares    Family History  Problem Relation Age of Onset  . Adopted: Yes  . Family history unknown: Yes    BP 138/80   Pulse 74   Wt 163 lb (73.9 kg)   LMP  (LMP Unknown)   BMI 23.39 kg/m   Review of Systems Depression persists.  Denies edema.      Objective:   Physical Exam VITAL SIGNS:  See vs page GENERAL: no distress NECK: There is no palpable  thyroid enlargement.  No thyroid nodule is palpable.  No palpable lymphadenopathy at the anterior neck. PSYCH: Alert and well-oriented.  Does not appear anxious nor depressed now.     Lab Results  Component Value Date   TSH 34.73 (H) 11/12/2015      Assessment & Plan:  anxiety: well-controlled, but she should minimize xanax Hypothyroidism: worse--? Compliance.  I have sent a prescription to your pharmacy, to increase.  Recheck TSH in 30d Back injury, clinically better Insomnia: well-controlled:

## 2015-11-12 NOTE — Patient Instructions (Addendum)
Please reduce the alprazolam to twice a day. Here are prescription refills.  I'm happy that your back pain is improving.  Please come back for a follow-up appointment in 6 months.

## 2016-01-15 DIAGNOSIS — M545 Low back pain: Secondary | ICD-10-CM | POA: Diagnosis not present

## 2016-01-15 DIAGNOSIS — M25551 Pain in right hip: Secondary | ICD-10-CM | POA: Diagnosis not present

## 2016-01-15 DIAGNOSIS — R262 Difficulty in walking, not elsewhere classified: Secondary | ICD-10-CM | POA: Diagnosis not present

## 2016-01-15 DIAGNOSIS — M6281 Muscle weakness (generalized): Secondary | ICD-10-CM | POA: Diagnosis not present

## 2016-01-22 DIAGNOSIS — M545 Low back pain: Secondary | ICD-10-CM | POA: Diagnosis not present

## 2016-01-22 DIAGNOSIS — M25551 Pain in right hip: Secondary | ICD-10-CM | POA: Diagnosis not present

## 2016-01-22 DIAGNOSIS — M6281 Muscle weakness (generalized): Secondary | ICD-10-CM | POA: Diagnosis not present

## 2016-01-22 DIAGNOSIS — R262 Difficulty in walking, not elsewhere classified: Secondary | ICD-10-CM | POA: Diagnosis not present

## 2016-01-28 DIAGNOSIS — M6281 Muscle weakness (generalized): Secondary | ICD-10-CM | POA: Diagnosis not present

## 2016-01-28 DIAGNOSIS — M545 Low back pain: Secondary | ICD-10-CM | POA: Diagnosis not present

## 2016-01-28 DIAGNOSIS — R262 Difficulty in walking, not elsewhere classified: Secondary | ICD-10-CM | POA: Diagnosis not present

## 2016-01-28 DIAGNOSIS — M25551 Pain in right hip: Secondary | ICD-10-CM | POA: Diagnosis not present

## 2016-01-30 DIAGNOSIS — R262 Difficulty in walking, not elsewhere classified: Secondary | ICD-10-CM | POA: Diagnosis not present

## 2016-01-30 DIAGNOSIS — M25551 Pain in right hip: Secondary | ICD-10-CM | POA: Diagnosis not present

## 2016-01-30 DIAGNOSIS — M545 Low back pain: Secondary | ICD-10-CM | POA: Diagnosis not present

## 2016-01-30 DIAGNOSIS — M6281 Muscle weakness (generalized): Secondary | ICD-10-CM | POA: Diagnosis not present

## 2016-02-04 DIAGNOSIS — M25551 Pain in right hip: Secondary | ICD-10-CM | POA: Diagnosis not present

## 2016-02-04 DIAGNOSIS — M545 Low back pain: Secondary | ICD-10-CM | POA: Diagnosis not present

## 2016-02-04 DIAGNOSIS — R262 Difficulty in walking, not elsewhere classified: Secondary | ICD-10-CM | POA: Diagnosis not present

## 2016-02-04 DIAGNOSIS — M6281 Muscle weakness (generalized): Secondary | ICD-10-CM | POA: Diagnosis not present

## 2016-02-10 ENCOUNTER — Ambulatory Visit (INDEPENDENT_AMBULATORY_CARE_PROVIDER_SITE_OTHER): Payer: BLUE CROSS/BLUE SHIELD | Admitting: Endocrinology

## 2016-02-10 ENCOUNTER — Encounter: Payer: Self-pay | Admitting: Endocrinology

## 2016-02-10 VITALS — BP 112/72 | HR 88 | Ht 70.0 in | Wt 169.0 lb

## 2016-02-10 DIAGNOSIS — E039 Hypothyroidism, unspecified: Secondary | ICD-10-CM | POA: Diagnosis not present

## 2016-02-10 DIAGNOSIS — Z23 Encounter for immunization: Secondary | ICD-10-CM | POA: Diagnosis not present

## 2016-02-10 LAB — TSH: TSH: 0.43 u[IU]/mL (ref 0.35–4.50)

## 2016-02-10 MED ORDER — BENZONATATE 100 MG PO CAPS: 100.0000 mg | ORAL_CAPSULE | Freq: Three times a day (TID) | ORAL | 1 refills | Status: DC | PRN

## 2016-02-10 MED ORDER — ALPRAZOLAM 0.5 MG PO TABS: 0.5000 mg | ORAL_TABLET | Freq: Three times a day (TID) | ORAL | 5 refills | Status: DC | PRN

## 2016-02-10 MED ORDER — CEFUROXIME AXETIL 250 MG PO TABS: 250.0000 mg | ORAL_TABLET | Freq: Two times a day (BID) | ORAL | 0 refills | Status: AC

## 2016-02-10 NOTE — Progress Notes (Signed)
Subjective:    Patient ID: Alyssa Garner, female    DOB: Mar 22, 1965, 50 y.o.   MRN: [id number]  HPI Pt states 4 days of slightly prod cough in the chest, and assoc nasal congestion.   She seldom misses the synthroid.   Past Medical History:  Diagnosis Date  . Anxiety   . Arthritis    "neck" (06/25/2015)  . Complication of anesthesia 1997   pt states with her first CS she had horrible pain and felt bad; "like it didn't kick in"  . Depression   . ETOH abuse   . GERD (gastroesophageal reflux disease)    occ OTC  . Heart murmur   . HYPOTHYROIDISM 04/09/2008  . Infection, kidney 1997   "hospitalized for 1 wk"  . Long-term current use of steroids   . LOW BACK PAIN 12/13/2006  . Osteoporosis    no treatment    Past Surgical History:  Procedure Laterality Date  . ABDOMINAL HYSTERECTOMY  10/2014   "left my ovaries"  . AUGMENTATION MAMMAPLASTY Bilateral ~ 1989; 2012  . CESAREAN SECTION  1998; 2000; 2007  . TUBAL LIGATION  2007    Social History   Social History  . Marital status: Married    Spouse name: N/A  . Number of children: 3  . Years of education: N/A   Occupational History  .  [employer]   Social History Main Topics  . Smoking status: Former Smoker    Packs/day: 0.25    Years: 10.00    Types: Cigarettes  . Smokeless tobacco: Never Used     Comment: "quit smoking cigarettes in ~ 1990"  . Alcohol use Yes     Comment: 06/25/2015 "stopped 06/19/2015"  . Drug use: No  . Sexual activity: Yes    Birth control/ protection: Surgical   Other Topics Concern  . Not on file   Social History Narrative  . No narrative on file    Current Outpatient Prescriptions on File Prior to Visit  Medication Sig Dispense Refill  . FLUoxetine (PROZAC) 40 MG capsule Take 2 capsules (80 mg total) by mouth daily. 180 capsule 3  . ibuprofen (ADVIL,MOTRIN) 600 MG tablet Take 1 tablet (600 mg total) by mouth every 8 (eight) hours as needed. 90 tablet 11  . levothyroxine  (SYNTHROID, LEVOTHROID) 200 MCG tablet Take 1 tablet (200 mcg total) by mouth daily before breakfast. 30 tablet 2  . Multiple Vitamin (MULTIVITAMIN WITH MINERALS) TABS tablet Take 1 tablet by mouth daily. Reported on 07/10/2015    . Probiotic Product (PROBIOTIC DAILY PO) Take 1 capsule by mouth daily. Reported on 07/10/2015    . zolpidem (AMBIEN) 10 MG tablet Take 1 tablet (10 mg total) by mouth at bedtime as needed for sleep. 90 tablet 1   No current facility-administered medications on file prior to visit.     Allergies  Allergen Reactions  . Trazodone And Nefazodone     nightmares    Family History  Problem Relation Age of Onset  . Adopted: Yes  . Family history unknown: Yes    BP 112/72   Pulse 88   Ht 5\' 10"  (1.778 m)   Wt 169 lb (76.7 kg)   LMP  (LMP Unknown)   SpO2 96%   BMI 24.25 kg/m    Review of Systems Fever is resolved.  Denies sob.  Anxiety is slightly worse.      Objective:   Physical Exam VITAL SIGNS:  See vs page  GENERAL: no distress head: no deformity  eyes: no periorbital swelling, no proptosis  external nose and ears are normal  mouth: no lesion seen Both eac's and tm's are normal LUNGS:  Clear to auscultation  Lab Results  Component Value Date   TSH 0.43 02/10/2016      Assessment & Plan:  URI: new: I have sent 2 prescriptions to your pharmacy: antibiotic and cough.  I hope you feel better soon.  If you don't feel better by next week, please call back.  Please call sooner if you get worse Anxiety: slightly worse: here is a prescription, to increase the alprazolam.  Hypothyroidism: well-replaced.  Please continue the same medication.

## 2016-02-10 NOTE — Patient Instructions (Addendum)
I have sent 2 prescriptions to your pharmacy: antibiotic and cough here is a prescription, to increase the alprazolam. A thyroid blood test is requested for you today.  We'll let you know about the results.  It is better to never miss the levothyroxine.  However, if you miss it, you can take 2 the next day.  I hope you feel better soon.  If you don't feel better by next week, please call back.  Please call sooner if you get worse.

## 2016-02-25 ENCOUNTER — Telehealth: Payer: Self-pay | Admitting: Endocrinology

## 2016-02-25 MED ORDER — ALPRAZOLAM 0.5 MG PO TABS: 0.5000 mg | ORAL_TABLET | Freq: Three times a day (TID) | ORAL | 5 refills | Status: DC | PRN

## 2016-02-25 NOTE — Telephone Encounter (Signed)
I printed  

## 2016-02-25 NOTE — Telephone Encounter (Signed)
Patient need a refill of medication  ALPRAZolam (XANAX) 0.5 MG tablet Today, please give her a call when ready.

## 2016-02-25 NOTE — Telephone Encounter (Signed)
See message and please advise, Thanks!  

## 2016-02-25 NOTE — Telephone Encounter (Signed)
I contacted the pharmacy and they never received the fax from 02/10/2016 for the xanax. The rx has been put to shred, can we print another rx?

## 2016-02-25 NOTE — Telephone Encounter (Signed)
I contacted the patient and advised of message via voicemail. [facility]

## 2016-02-25 NOTE — Telephone Encounter (Signed)
Was last refilled on 02/10/16, and she has multiple refills.

## 2016-02-25 NOTE — Telephone Encounter (Signed)
Please call [facility] for the pt they are stating the rx they have on file has her only taking it 2 times a day I read to her and informed her that the rx we called in on 11/27 clearly states she can take it 3 times a day

## 2016-02-26 NOTE — Telephone Encounter (Signed)
Prescription faxed

## 2016-02-27 ENCOUNTER — Other Ambulatory Visit: Payer: Self-pay | Admitting: Endocrinology

## 2016-03-19 DIAGNOSIS — F322 Major depressive disorder, single episode, severe without psychotic features: Secondary | ICD-10-CM | POA: Diagnosis not present

## 2016-04-27 ENCOUNTER — Other Ambulatory Visit: Payer: Self-pay | Admitting: Endocrinology

## 2016-04-27 DIAGNOSIS — F1020 Alcohol dependence, uncomplicated: Secondary | ICD-10-CM | POA: Diagnosis not present

## 2016-04-27 DIAGNOSIS — F339 Major depressive disorder, recurrent, unspecified: Secondary | ICD-10-CM | POA: Diagnosis not present

## 2016-04-29 DIAGNOSIS — F339 Major depressive disorder, recurrent, unspecified: Secondary | ICD-10-CM | POA: Diagnosis not present

## 2016-04-29 DIAGNOSIS — F1020 Alcohol dependence, uncomplicated: Secondary | ICD-10-CM | POA: Diagnosis not present

## 2016-05-04 DIAGNOSIS — F339 Major depressive disorder, recurrent, unspecified: Secondary | ICD-10-CM | POA: Diagnosis not present

## 2016-05-04 DIAGNOSIS — F1020 Alcohol dependence, uncomplicated: Secondary | ICD-10-CM | POA: Diagnosis not present

## 2016-05-06 DIAGNOSIS — F339 Major depressive disorder, recurrent, unspecified: Secondary | ICD-10-CM | POA: Diagnosis not present

## 2016-05-06 DIAGNOSIS — F1020 Alcohol dependence, uncomplicated: Secondary | ICD-10-CM | POA: Diagnosis not present

## 2016-05-07 DIAGNOSIS — F339 Major depressive disorder, recurrent, unspecified: Secondary | ICD-10-CM | POA: Diagnosis not present

## 2016-05-07 DIAGNOSIS — F1020 Alcohol dependence, uncomplicated: Secondary | ICD-10-CM | POA: Diagnosis not present

## 2016-05-08 DIAGNOSIS — F339 Major depressive disorder, recurrent, unspecified: Secondary | ICD-10-CM | POA: Diagnosis not present

## 2016-05-08 DIAGNOSIS — F1020 Alcohol dependence, uncomplicated: Secondary | ICD-10-CM | POA: Diagnosis not present

## 2016-05-11 DIAGNOSIS — F1020 Alcohol dependence, uncomplicated: Secondary | ICD-10-CM | POA: Diagnosis not present

## 2016-05-11 DIAGNOSIS — F339 Major depressive disorder, recurrent, unspecified: Secondary | ICD-10-CM | POA: Diagnosis not present

## 2016-05-13 ENCOUNTER — Ambulatory Visit: Payer: Self-pay | Admitting: Endocrinology

## 2016-05-13 DIAGNOSIS — F1020 Alcohol dependence, uncomplicated: Secondary | ICD-10-CM | POA: Diagnosis not present

## 2016-05-13 DIAGNOSIS — F339 Major depressive disorder, recurrent, unspecified: Secondary | ICD-10-CM | POA: Diagnosis not present

## 2016-05-21 DIAGNOSIS — F339 Major depressive disorder, recurrent, unspecified: Secondary | ICD-10-CM | POA: Diagnosis not present

## 2016-05-21 DIAGNOSIS — F1020 Alcohol dependence, uncomplicated: Secondary | ICD-10-CM | POA: Diagnosis not present

## 2016-05-26 ENCOUNTER — Other Ambulatory Visit: Payer: Self-pay | Admitting: Endocrinology

## 2016-05-27 DIAGNOSIS — F1020 Alcohol dependence, uncomplicated: Secondary | ICD-10-CM | POA: Diagnosis not present

## 2016-05-27 DIAGNOSIS — Z1231 Encounter for screening mammogram for malignant neoplasm of breast: Secondary | ICD-10-CM | POA: Diagnosis not present

## 2016-05-27 DIAGNOSIS — F339 Major depressive disorder, recurrent, unspecified: Secondary | ICD-10-CM | POA: Diagnosis not present

## 2016-06-02 DIAGNOSIS — F322 Major depressive disorder, single episode, severe without psychotic features: Secondary | ICD-10-CM | POA: Diagnosis not present

## 2016-06-22 DIAGNOSIS — F1020 Alcohol dependence, uncomplicated: Secondary | ICD-10-CM | POA: Diagnosis not present

## 2016-06-22 DIAGNOSIS — F339 Major depressive disorder, recurrent, unspecified: Secondary | ICD-10-CM | POA: Diagnosis not present

## 2016-06-23 ENCOUNTER — Other Ambulatory Visit: Payer: Self-pay | Admitting: Endocrinology

## 2016-06-26 ENCOUNTER — Telehealth: Payer: Self-pay | Admitting: Endocrinology

## 2016-06-26 MED ORDER — ZOLPIDEM TARTRATE 10 MG PO TABS: 10.0000 mg | ORAL_TABLET | Freq: Every evening | ORAL | 2 refills | Status: DC | PRN

## 2016-06-26 NOTE — Telephone Encounter (Signed)
Pt needs refills on ambien [facility]

## 2016-06-26 NOTE — Telephone Encounter (Signed)
See message. Ok to refill? Thanks!

## 2016-06-26 NOTE — Telephone Encounter (Signed)
I printed  

## 2016-06-26 NOTE — Telephone Encounter (Signed)
Refill submitted. 

## 2016-06-26 NOTE — Addendum Note (Signed)
Addended by: Romero Belling on: 06/26/2016 12:00 PM   Modules accepted: Orders

## 2016-06-28 ENCOUNTER — Other Ambulatory Visit: Payer: Self-pay | Admitting: Endocrinology

## 2016-07-09 DIAGNOSIS — F322 Major depressive disorder, single episode, severe without psychotic features: Secondary | ICD-10-CM | POA: Diagnosis not present

## 2016-08-18 ENCOUNTER — Telehealth: Payer: Self-pay | Admitting: Endocrinology

## 2016-08-18 ENCOUNTER — Other Ambulatory Visit: Payer: Self-pay

## 2016-08-18 MED ORDER — LEVOTHYROXINE SODIUM 200 MCG PO TABS: ORAL_TABLET | ORAL | 0 refills | Status: DC

## 2016-08-18 NOTE — Telephone Encounter (Signed)
**  Remind patient they can make refill requests via MyChart**  Medication refill request (Name & Dosage):  levothyroxine (SYNTHROID, LEVOTHROID) 200 MCG tablet  Preferred pharmacy (Name & [facility]  [facility] [address] - [place], [place] [phone] (Phone) [phone] (Fax)      Other comments (if applicable):    Please contact patient once rx has been placed.

## 2016-08-18 NOTE — Telephone Encounter (Signed)
Patient called to advise that her last no show 05-13-16 was due to a unplanned dismissal from her employment and she was not able to make her appointment.   Patient would like her last no show appointment excused due to this event and the bill be excused as well. I advised the patient that Judeth CornfieldStephanie would be in contact with her if this would be possible or not. Please call patient to advise.

## 2016-08-18 NOTE — Telephone Encounter (Signed)
Let the pt know that we can remove the NS fee for this date, she was appreciative.

## 2016-08-18 NOTE — Telephone Encounter (Signed)
Submitted

## 2016-08-20 DIAGNOSIS — M542 Cervicalgia: Secondary | ICD-10-CM | POA: Diagnosis not present

## 2016-08-20 DIAGNOSIS — S32001D Stable burst fracture of unspecified lumbar vertebra, subsequent encounter for fracture with routine healing: Secondary | ICD-10-CM | POA: Diagnosis not present

## 2016-08-20 DIAGNOSIS — R51 Headache: Secondary | ICD-10-CM | POA: Diagnosis not present

## 2016-08-28 DIAGNOSIS — M25511 Pain in right shoulder: Secondary | ICD-10-CM | POA: Diagnosis not present

## 2016-08-28 DIAGNOSIS — M546 Pain in thoracic spine: Secondary | ICD-10-CM | POA: Diagnosis not present

## 2016-08-28 DIAGNOSIS — M629 Disorder of muscle, unspecified: Secondary | ICD-10-CM | POA: Diagnosis not present

## 2016-08-28 DIAGNOSIS — M542 Cervicalgia: Secondary | ICD-10-CM | POA: Diagnosis not present

## 2016-09-04 DIAGNOSIS — M546 Pain in thoracic spine: Secondary | ICD-10-CM | POA: Diagnosis not present

## 2016-09-04 DIAGNOSIS — M629 Disorder of muscle, unspecified: Secondary | ICD-10-CM | POA: Diagnosis not present

## 2016-09-04 DIAGNOSIS — M25511 Pain in right shoulder: Secondary | ICD-10-CM | POA: Diagnosis not present

## 2016-09-04 DIAGNOSIS — M542 Cervicalgia: Secondary | ICD-10-CM | POA: Diagnosis not present

## 2016-09-14 ENCOUNTER — Other Ambulatory Visit: Payer: Self-pay | Admitting: Endocrinology

## 2016-09-15 ENCOUNTER — Ambulatory Visit (INDEPENDENT_AMBULATORY_CARE_PROVIDER_SITE_OTHER): Payer: BLUE CROSS/BLUE SHIELD | Admitting: Endocrinology

## 2016-09-15 VITALS — BP 132/84 | HR 97 | Ht 70.0 in | Wt 152.0 lb

## 2016-09-15 DIAGNOSIS — E039 Hypothyroidism, unspecified: Secondary | ICD-10-CM | POA: Diagnosis not present

## 2016-09-15 DIAGNOSIS — Z0000 Encounter for general adult medical examination without abnormal findings: Secondary | ICD-10-CM | POA: Diagnosis not present

## 2016-09-15 DIAGNOSIS — Z23 Encounter for immunization: Secondary | ICD-10-CM

## 2016-09-15 LAB — CBC WITH DIFFERENTIAL/PLATELET
BASOS ABS: 0 10*3/uL (ref 0.0–0.1)
Basophils Relative: 0.4 % (ref 0.0–3.0)
EOS ABS: 0.1 10*3/uL (ref 0.0–0.7)
Eosinophils Relative: 1.6 % (ref 0.0–5.0)
HCT: 40 % (ref 36.0–46.0)
HEMOGLOBIN: 13.5 g/dL (ref 12.0–15.0)
Lymphocytes Relative: 42.7 % (ref 12.0–46.0)
Lymphs Abs: 2.8 10*3/uL (ref 0.7–4.0)
MCHC: 33.7 g/dL (ref 30.0–36.0)
MCV: 85.6 fl (ref 78.0–100.0)
MONO ABS: 0.4 10*3/uL (ref 0.1–1.0)
Monocytes Relative: 6.8 % (ref 3.0–12.0)
Neutro Abs: 3.2 10*3/uL (ref 1.4–7.7)
Neutrophils Relative %: 48.5 % (ref 43.0–77.0)
Platelets: 258 10*3/uL (ref 150.0–400.0)
RBC: 4.68 Mil/uL (ref 3.87–5.11)
RDW: 13.1 % (ref 11.5–15.5)
WBC: 6.6 10*3/uL (ref 4.0–10.5)

## 2016-09-15 LAB — HEPATIC FUNCTION PANEL
ALBUMIN: 4.4 g/dL (ref 3.5–5.2)
ALT: 13 U/L (ref 0–35)
AST: 17 U/L (ref 0–37)
Alkaline Phosphatase: 61 U/L (ref 39–117)
Bilirubin, Direct: 0.1 mg/dL (ref 0.0–0.3)
TOTAL PROTEIN: 7.4 g/dL (ref 6.0–8.3)
Total Bilirubin: 0.4 mg/dL (ref 0.2–1.2)

## 2016-09-15 LAB — LIPID PANEL
CHOLESTEROL: 189 mg/dL (ref 0–200)
HDL: 64.5 mg/dL (ref >39.00)
LDL CALC: 106 mg/dL — AB (ref 0–99)
NonHDL: 124.46
TRIGLYCERIDES: 92 mg/dL (ref 0.0–149.0)
Total CHOL/HDL Ratio: 3
VLDL: 18.4 mg/dL (ref 0.0–40.0)

## 2016-09-15 LAB — BASIC METABOLIC PANEL
BUN: 12 mg/dL (ref 6–23)
CO2: 30 mEq/L (ref 19–32)
Calcium: 9.4 mg/dL (ref 8.4–10.5)
Chloride: 102 mEq/L (ref 96–112)
Creatinine, Ser: 0.71 mg/dL (ref 0.40–1.20)
GFR: 92.6 mL/min (ref >60.00)
GLUCOSE: 107 mg/dL — AB (ref 70–99)
POTASSIUM: 4.2 meq/L (ref 3.5–5.1)
Sodium: 137 mEq/L (ref 135–145)

## 2016-09-15 LAB — TSH

## 2016-09-15 MED ORDER — ZOLPIDEM TARTRATE 10 MG PO TABS: 10.0000 mg | ORAL_TABLET | Freq: Every evening | ORAL | 2 refills | Status: DC | PRN

## 2016-09-15 MED ORDER — TRETINOIN 0.025 % EX CREA: TOPICAL_CREAM | Freq: Every day | CUTANEOUS | 11 refills | Status: DC

## 2016-09-15 MED ORDER — LEVOTHYROXINE SODIUM 175 MCG PO TABS: 175.0000 ug | ORAL_TABLET | Freq: Every day | ORAL | 5 refills | Status: DC

## 2016-09-15 NOTE — Patient Instructions (Addendum)
Please consider these measures for your health:  minimize alcohol.  Do not use tobacco products.  Have a colonoscopy at least every 10 years from age 50.  Women should have an annual mammogram from age 40.  Keep firearms safely stored.  Always use seat belts.  have working smoke alarms in your home.  See an eye doctor and dentist regularly.  Never drive under the influence of alcohol or drugs (including prescription drugs).  Those with fair skin should take precautions against the sun, and should carefully examine their skin once per month, for any new or changed moles. blood tests are requested for you today.  We'll let you know about the results.   Please return in 1 year.  

## 2016-09-15 NOTE — Progress Notes (Signed)
Subjective:    Patient ID: Alyssa Garner, female    DOB: [birth year], 51 y.o.   MRN: [id number]  HPI Pt is here for regular wellness examination, and is feeling pretty well in general, and says chronic med probs are stable, except as noted below Past Medical History:  Diagnosis Date  . Anxiety   . Arthritis    "neck" (06/25/2015)  . Complication of anesthesia 1997   pt states with her first CS she had horrible pain and felt bad; "like it didn't kick in"  . Depression   . ETOH abuse   . GERD (gastroesophageal reflux disease)    occ OTC  . Heart murmur   . HYPOTHYROIDISM 04/09/2008  . Infection, kidney 1997   "hospitalized for 1 wk"  . Long-term current use of steroids   . LOW BACK PAIN 12/13/2006  . Osteoporosis    no treatment    Past Surgical History:  Procedure Laterality Date  . ABDOMINAL HYSTERECTOMY  10/2014   "left my ovaries"  . AUGMENTATION MAMMAPLASTY Bilateral ~ 1989; 2012  . CESAREAN SECTION  1998; 2000; 2007  . TUBAL LIGATION  2007    Social History   Social History  . Marital status: Married    Spouse name: N/A  . Number of children: 3  . Years of education: N/A   Occupational History  .  [employer]   Social History Main Topics  . Smoking status: Former Smoker    Packs/day: 0.25    Years: 10.00    Types: Cigarettes  . Smokeless tobacco: Never Used     Comment: "quit smoking cigarettes in ~ 1990"  . Alcohol use Yes     Comment: 06/25/2015 "stopped 06/19/2015"  . Drug use: No  . Sexual activity: Yes    Birth control/ protection: Surgical   Other Topics Concern  . Not on file   Social History Narrative  . No narrative on file    Current Outpatient Prescriptions on File Prior to Visit  Medication Sig Dispense Refill  . ALPRAZolam (XANAX) 0.5 MG tablet TAKE ONE TABLET BY MOUTH THREE TIMES A DAY AS NEEDED FOR ANXIETY 90 tablet 4  . ibuprofen (ADVIL,MOTRIN) 600 MG tablet Take 1 tablet (600 mg total) by mouth every 8 (eight) hours  as needed. 90 tablet 11  . Multiple Vitamin (MULTIVITAMIN WITH MINERALS) TABS tablet Take 1 tablet by mouth daily. Reported on 07/10/2015    . Probiotic Product (PROBIOTIC DAILY PO) Take 1 capsule by mouth daily. Reported on 07/10/2015     No current facility-administered medications on file prior to visit.     Allergies  Allergen Reactions  . Trazodone And Nefazodone     nightmares    Family History  Problem Relation Age of Onset  . Adopted: Yes  . Family history unknown: Yes    BP 132/84   Pulse 97   Ht 5\' 10"  (1.778 m)   Wt 152 lb (68.9 kg)   LMP  (LMP Unknown) Comment: AUB  SpO2 99%   BMI 21.81 kg/m     Review of Systems  Constitutional: Negative for fever.  HENT: Negative for hearing loss.   Eyes: Negative for visual disturbance.  Respiratory: Negative for shortness of breath.   Cardiovascular: Negative for chest pain.  Gastrointestinal: Negative for anal bleeding.  Endocrine: Negative for cold intolerance.  Genitourinary: Negative for hematuria.  Musculoskeletal:       Back pain is much better  Skin: Negative for  rash.  Allergic/Immunologic: Negative for environmental allergies.  Neurological: Negative for seizures.  Hematological: Does not bruise/bleed easily.  Psychiatric/Behavioral:       Insomnia is well-controlled      Objective:   Physical Exam VS: see vs page GEN: no distress HEAD: head: no deformity eyes: no periorbital swelling, no proptosis external nose and ears are normal mouth: no lesion seen NECK: supple, thyroid is not enlarged CHEST WALL: no deformity LUNGS:  Clear to auscultation BREASTS: sees gyn.  CV: reg rate and rhythm, no murmur ABD: abdomen is soft, nontender.  no hepatosplenomegaly.  not distended.  no hernia GENITALIA/RECTAL: sees gyn MUSCULOSKELETAL: muscle bulk and strength are grossly normal.  no obvious joint swelling.  gait is normal and steady EXTEMITIES: no deformity.  no ulcer on the feet.  feet are of normal color  and temp.  no edema PULSES: dorsalis pedis intact bilat.  no carotid bruit NEURO:  cn 2-12 grossly intact.   readily moves all 4's.  sensation is intact to touch on the feet SKIN:  Normal texture and temperature.  No rash or suspicious lesion is visible.   NODES:  None palpable at the neck PSYCH: alert, well-oriented.  Does not appear anxious nor depressed.   I personally reviewed electrocardiogram tracing (today): Indication: wellness Impression: NSR.  No MI.  No hypertrophy.  Several borderline abnrmalities Compared to: no significant change.     Assessment & Plan:  Wellness visit today, with problems stable, except as noted.   Patient Instructions  Please consider these measures for your health:  minimize alcohol.  Do not use tobacco products.  Have a colonoscopy at least every 10 years from age 50.  Women should have an annual mammogram from age 50.  Keep firearms safely stored.  Always use seat belts.  have working smoke alarms in your home.  See an eye doctor and dentist regularly.  Never drive under the influence of alcohol or drugs (including prescription drugs).  Those with fair skin should take precautions against the sun, and should carefully examine their skin once per month, for any new or changed moles.  blood tests are requested for you today.  We'll let you know about the results.   Please return in 1 year.

## 2016-09-16 LAB — HIV ANTIBODY (ROUTINE TESTING W REFLEX): HIV 1&2 Ab, 4th Generation: NONREACTIVE

## 2016-09-22 ENCOUNTER — Other Ambulatory Visit: Payer: Self-pay

## 2016-09-22 MED ORDER — LEVOTHYROXINE SODIUM 175 MCG PO TABS: 175.0000 ug | ORAL_TABLET | Freq: Every day | ORAL | 1 refills | Status: DC

## 2016-09-24 ENCOUNTER — Telehealth: Payer: Self-pay | Admitting: Endocrinology

## 2016-09-24 DIAGNOSIS — F322 Major depressive disorder, single episode, severe without psychotic features: Secondary | ICD-10-CM | POA: Diagnosis not present

## 2016-09-24 NOTE — Telephone Encounter (Signed)
please call patient: Ins won't pay for this.  You can still buy without insurance, though.  Shopping around helps. I would be happy to send rx somewhere else.

## 2016-09-25 ENCOUNTER — Telehealth: Payer: BLUE CROSS/BLUE SHIELD | Admitting: Nurse Practitioner

## 2016-09-25 DIAGNOSIS — J029 Acute pharyngitis, unspecified: Secondary | ICD-10-CM

## 2016-09-25 DIAGNOSIS — R5081 Fever presenting with conditions classified elsewhere: Secondary | ICD-10-CM

## 2016-09-25 MED ORDER — AMOXICILLIN-POT CLAVULANATE 875-125 MG PO TABS: 1.0000 | ORAL_TABLET | Freq: Two times a day (BID) | ORAL | 0 refills | Status: DC

## 2016-09-25 NOTE — Telephone Encounter (Signed)
I contacted the patient and advised of message via voicemail. Requested a call back if the patient would like to discuss further.  

## 2016-09-25 NOTE — Progress Notes (Signed)
We are sorry that you are not feeling well.  Here is how we plan to help!  Based on what you have shared with me it looks like you have a sore throat possibly due to strep pharyngitis.  Strep pharyngitis is inflammation and infection in the back of the throat.  This is an infection caused by bacteria and is treated with antibiotics. I have prescribed Augmentin 875mg /125mg  one tablet twice daily with food, for 7 days. one tablet twice daily with food, for 7 days. You may use an oral throat lozenges as needed. Strep infections are not as easily transmitted as other respiratory infection, however we still recommend that you avoid close contact with loved ones, especially the very young and elderly.  Remember to wash your hands thoroughly throughout the day as this is the number one way to prevent the spread of infection!  Home Care:  Only take medications as instructed by your medical team.  Complete the entire course of an antibiotic.  Do not take these medications with alcohol.  A steam or ultrasonic humidifier can help congestion.  You can place a towel over your head and breathe in the steam from hot water coming from a faucet.  Avoid close contacts especially the very young and the elderly.  Cover your mouth when you cough or sneeze.  Always remember to wash your hands.  Get Help Right Away If:  You develop worsening fever or sinus pain.  You develop a severe head ache or visual changes.  Your symptoms persist after you have completed your treatment plan.  Make sure you  Understand these instructions.  Will watch your condition.  Will get help right away if you are not doing well or get worse.  Your e-visit answers were reviewed by a board certified advanced clinical practitioner to complete your personal care plan.  Depending on the condition, your plan could have included both over the counter or prescription medications.  If there is a problem please reply  once you have  received a response from your provider.  Your safety is important to us.  If you have drug allergies check your prescription carefully.    You can use MyChart to ask questions about today's visit, request a non-urgent call back, or ask for a work or school excuse for 24 hours related to this e-Visit. If it has been greater than 24 hours you will need to follow up with your provider, or enter a new e-Visit to address those concerns.  You will get an e-mail in the next two days asking about your experience.  I hope that your e-visit has been valuable and will speed your recovery. Thank you for using e-visits.

## 2016-09-26 ENCOUNTER — Ambulatory Visit: Payer: Self-pay | Admitting: Family Medicine

## 2016-10-30 ENCOUNTER — Other Ambulatory Visit: Payer: Self-pay

## 2016-11-10 DIAGNOSIS — K219 Gastro-esophageal reflux disease without esophagitis: Secondary | ICD-10-CM | POA: Diagnosis not present

## 2016-11-10 DIAGNOSIS — H903 Sensorineural hearing loss, bilateral: Secondary | ICD-10-CM | POA: Diagnosis not present

## 2016-11-10 DIAGNOSIS — H9313 Tinnitus, bilateral: Secondary | ICD-10-CM | POA: Diagnosis not present

## 2016-11-10 DIAGNOSIS — F17210 Nicotine dependence, cigarettes, uncomplicated: Secondary | ICD-10-CM | POA: Diagnosis not present

## 2016-11-11 ENCOUNTER — Other Ambulatory Visit: Payer: Self-pay | Admitting: Physician Assistant

## 2016-11-11 DIAGNOSIS — K219 Gastro-esophageal reflux disease without esophagitis: Secondary | ICD-10-CM

## 2016-11-11 DIAGNOSIS — R1312 Dysphagia, oropharyngeal phase: Secondary | ICD-10-CM

## 2016-11-19 ENCOUNTER — Encounter: Payer: Self-pay | Admitting: Endocrinology

## 2016-11-20 ENCOUNTER — Ambulatory Visit
Admission: RE | Admit: 2016-11-20 | Discharge: 2016-11-20 | Disposition: A | Discharge status: Home / Self Care | Payer: BLUE CROSS/BLUE SHIELD | Source: Ambulatory Visit | Attending: Physician Assistant | Admitting: Physician Assistant

## 2016-11-20 DIAGNOSIS — R1312 Dysphagia, oropharyngeal phase: Secondary | ICD-10-CM | POA: Diagnosis not present

## 2016-11-20 DIAGNOSIS — K219 Gastro-esophageal reflux disease without esophagitis: Secondary | ICD-10-CM

## 2016-12-10 ENCOUNTER — Other Ambulatory Visit: Payer: Self-pay | Admitting: Endocrinology

## 2017-01-05 ENCOUNTER — Other Ambulatory Visit (INDEPENDENT_AMBULATORY_CARE_PROVIDER_SITE_OTHER): Payer: BLUE CROSS/BLUE SHIELD

## 2017-01-05 DIAGNOSIS — E039 Hypothyroidism, unspecified: Secondary | ICD-10-CM | POA: Diagnosis not present

## 2017-01-05 LAB — TSH: TSH: 12.91 u[IU]/mL — AB (ref 0.35–4.50)

## 2017-01-06 DIAGNOSIS — F419 Anxiety disorder, unspecified: Secondary | ICD-10-CM | POA: Diagnosis not present

## 2017-01-08 ENCOUNTER — Encounter: Payer: Self-pay | Admitting: Endocrinology

## 2017-01-13 ENCOUNTER — Telehealth: Payer: Self-pay | Admitting: *Deleted

## 2017-01-13 ENCOUNTER — Other Ambulatory Visit: Payer: Self-pay

## 2017-01-13 DIAGNOSIS — Z0000 Encounter for general adult medical examination without abnormal findings: Secondary | ICD-10-CM

## 2017-01-13 MED ORDER — LEVOTHYROXINE SODIUM 175 MCG PO TABS: 175.0000 ug | ORAL_TABLET | Freq: Every day | ORAL | 1 refills | Status: DC

## 2017-01-13 NOTE — Telephone Encounter (Signed)
Patient states she is unsure if it is time for her to get a routine colonoscopy. Patient states you can call her if she needs one. Her contact number is [phone]. Please advise. Thank you

## 2017-01-13 NOTE — Telephone Encounter (Signed)
Patient notified

## 2017-01-13 NOTE — Telephone Encounter (Signed)
Patient called again pertaining to her refill of her medication . Please see below message

## 2017-01-13 NOTE — Telephone Encounter (Signed)
Yes, it is due.  you will receive a phone call, about a day and time for an appointment

## 2017-01-14 DIAGNOSIS — F322 Major depressive disorder, single episode, severe without psychotic features: Secondary | ICD-10-CM | POA: Diagnosis not present

## 2017-01-29 ENCOUNTER — Other Ambulatory Visit: Payer: Self-pay

## 2017-01-29 MED ORDER — LEVOTHYROXINE SODIUM 175 MCG PO TABS: 175.0000 ug | ORAL_TABLET | Freq: Every day | ORAL | 1 refills | Status: DC

## 2017-02-06 ENCOUNTER — Other Ambulatory Visit: Payer: Self-pay | Admitting: Endocrinology

## 2017-02-15 ENCOUNTER — Encounter: Payer: Self-pay | Admitting: Endocrinology

## 2017-02-15 ENCOUNTER — Telehealth: Payer: Self-pay | Admitting: Gastroenterology

## 2017-02-15 NOTE — Telephone Encounter (Signed)
Dr Christella HartiganJacobs please advise on the scheduling of the procedure and the location.

## 2017-02-15 NOTE — Telephone Encounter (Signed)
I think [facility] will be appropriate for colonoscopy

## 2017-02-17 ENCOUNTER — Other Ambulatory Visit: Payer: Self-pay | Admitting: Endocrinology

## 2017-02-24 NOTE — Telephone Encounter (Signed)
Left message for patient to call back and scheduled colonoscopy in [facility].

## 2017-03-21 ENCOUNTER — Other Ambulatory Visit: Payer: Self-pay | Admitting: Endocrinology

## 2017-04-15 DIAGNOSIS — F322 Major depressive disorder, single episode, severe without psychotic features: Secondary | ICD-10-CM | POA: Diagnosis not present

## 2017-07-14 DIAGNOSIS — F322 Major depressive disorder, single episode, severe without psychotic features: Secondary | ICD-10-CM | POA: Diagnosis not present

## 2017-10-10 ENCOUNTER — Emergency Department (HOSPITAL_COMMUNITY)
Admission: EM | Admit: 2017-10-10 | Discharge: 2017-10-10 | Disposition: A | Discharge status: Home / Self Care | Payer: BLUE CROSS/BLUE SHIELD | Attending: Emergency Medicine | Admitting: Emergency Medicine

## 2017-10-10 ENCOUNTER — Encounter (HOSPITAL_COMMUNITY): Payer: Self-pay | Admitting: Emergency Medicine

## 2017-10-10 DIAGNOSIS — E039 Hypothyroidism, unspecified: Secondary | ICD-10-CM | POA: Insufficient documentation

## 2017-10-10 DIAGNOSIS — S61215A Laceration without foreign body of left ring finger without damage to nail, initial encounter: Secondary | ICD-10-CM | POA: Diagnosis not present

## 2017-10-10 DIAGNOSIS — Y998 Other external cause status: Secondary | ICD-10-CM | POA: Diagnosis not present

## 2017-10-10 DIAGNOSIS — Z79899 Other long term (current) drug therapy: Secondary | ICD-10-CM | POA: Insufficient documentation

## 2017-10-10 DIAGNOSIS — Y939 Activity, unspecified: Secondary | ICD-10-CM | POA: Diagnosis not present

## 2017-10-10 DIAGNOSIS — W260XXA Contact with knife, initial encounter: Secondary | ICD-10-CM | POA: Diagnosis not present

## 2017-10-10 DIAGNOSIS — Y929 Unspecified place or not applicable: Secondary | ICD-10-CM | POA: Insufficient documentation

## 2017-10-10 DIAGNOSIS — Z87891 Personal history of nicotine dependence: Secondary | ICD-10-CM | POA: Diagnosis not present

## 2017-10-10 DIAGNOSIS — S61211A Laceration without foreign body of left index finger without damage to nail, initial encounter: Secondary | ICD-10-CM

## 2017-10-10 MED ORDER — LIDOCAINE HCL (PF) 1 % IJ SOLN
INTRAMUSCULAR | Status: AC
  Filled 2017-10-10: qty 5

## 2017-10-10 MED ORDER — HYDROCODONE-ACETAMINOPHEN 5-325 MG PO TABS
1.0000 | ORAL_TABLET | Freq: Once | ORAL | Status: AC
  Administered 2017-10-10: 1 via ORAL
  Filled 2017-10-10: qty 1

## 2017-10-10 MED ORDER — HYDROCODONE-ACETAMINOPHEN 5-325 MG PO TABS: 1.0000 | ORAL_TABLET | ORAL | 0 refills | Status: DC | PRN

## 2017-10-10 NOTE — ED Provider Notes (Signed)
[facility] EMERGENCY DEPARTMENT Provider Note   CSN: [id number] Arrival date & time: 10/10/17  1831     History   Chief Complaint Chief Complaint  Patient presents with  . Extremity Laceration    HPI Alyssa Garner is a 52 y.o. female.  The history is provided by the patient. No language interpreter was used.  Hand Pain  This is a new problem. The current episode started 3 to 5 hours ago. The problem occurs constantly. The problem has not changed since onset.Nothing aggravates the symptoms. Nothing relieves the symptoms. She has tried nothing for the symptoms. The treatment provided no relief.  Pt reports she cut finger with a knife.  Pt reports she has had bleeding for 4 hours.  Pt reports she could not get bleeding stopped.    Past Medical History:  Diagnosis Date  . Anxiety   . Arthritis    "neck" (06/25/2015)  . Complication of anesthesia 1997   pt states with her first CS she had horrible pain and felt bad; "like it didn't kick in"  . Depression   . ETOH abuse   . GERD (gastroesophageal reflux disease)    occ OTC  . Heart murmur   . HYPOTHYROIDISM 04/09/2008  . Infection, kidney 1997   "hospitalized for 1 wk"  . Long-term current use of steroids   . LOW BACK PAIN 12/13/2006  . Osteoporosis    no treatment    Patient Active Problem List   Diagnosis Date Noted  . Adjustment disorder with mixed anxiety and depressed mood 07/01/2015  . Burst fracture of lumbar vertebra (HCC) 06/25/2015  . Contusion of lower back 06/21/2015  . Fracture of lumbar spine (HCC) 06/21/2015  . Hyponatremia 05/14/2015  . Postmenopausal bleeding 11/29/2014  . S/P Davinci total laparoscopic hysterectomy with bilateral salpingectomy 11/29/2014  . Sinus disorder 04/18/2014  . Allergic rhinitis, cause unspecified 11/23/2011  . Cervical lymphadenitis 11/23/2011  . Disturbance of skin sensation 06/10/2010  . Routine general medical examination at a health care facility  06/10/2010  . OTHER ACNE 09/22/2009  . LYMPHADENOPATHY 06/13/2009  . Hypothyroidism 04/09/2008  . LOW BACK PAIN 12/13/2006    Past Surgical History:  Procedure Laterality Date  . ABDOMINAL HYSTERECTOMY  10/2014   "left my ovaries"  . AUGMENTATION MAMMAPLASTY Bilateral ~ 1989; 2012  . CESAREAN SECTION  1998; 2000; 2007  . TUBAL LIGATION  2007     OB History   None      Home Medications    Prior to Admission medications   Medication Sig Start Date End Date Taking? Authorizing Provider  ALPRAZolam Prudy Feeler) 0.5 MG tablet TAKE ONE TABLET BY MOUTH THREE TIMES A DAY AS NEEDED FOR ANXIETY 09/14/16   Romero Belling, MD  amoxicillin-clavulanate (AUGMENTIN) 875-125 MG tablet Take 1 tablet by mouth 2 (two) times daily. 09/25/16   Daphine Deutscher, Mary-Margaret, FNP  ibuprofen (ADVIL,MOTRIN) 600 MG tablet Take 1 tablet (600 mg total) by mouth every 8 (eight) hours as needed. 07/10/15   Romero Belling, MD  levothyroxine (SYNTHROID, LEVOTHROID) 175 MCG tablet Take 1 tablet (175 mcg total) daily before breakfast by mouth. 01/29/17   Romero Belling, MD  Multiple Vitamin (MULTIVITAMIN WITH MINERALS) TABS tablet Take 1 tablet by mouth daily. Reported on 07/10/2015    [provider]  Probiotic Product (PROBIOTIC DAILY PO) Take 1 capsule by mouth daily. Reported on 07/10/2015    [provider]  tretinoin (RETIN-A) 0.025 % cream Apply topically at bedtime. 09/15/16  Romero Belling, MD  zolpidem (AMBIEN) 10 MG tablet TAKE ONE TABLET BY MOUTH EVERY NIGHT AT BEDTIME AS NEEDED FOR SLEEP 03/22/17   Romero Belling, MD    Family History Family History  Adopted: Yes  Family history unknown: Yes    Social History Social History   Tobacco Use  . Smoking status: Former Smoker    Packs/day: 0.25    Years: 10.00    Pack years: 2.50    Types: Cigarettes  . Smokeless tobacco: Never Used  . Tobacco comment: "quit smoking cigarettes in ~ 1990"  Substance Use Topics  . Alcohol use: Yes    Comment:  06/25/2015 "stopped 06/19/2015"  . Drug use: No     Allergies   Trazodone and nefazodone   Review of Systems Review of Systems  Skin: Positive for wound.  All other systems reviewed and are negative.    Physical Exam Updated Vital Signs BP (!) 128/92 (BP Location: Right Arm)   Pulse 98   Temp 98.5 F (36.9 C) (Oral)   Resp 18   Ht 5\' 11"  (1.803 m)   LMP  (LMP Unknown) Comment: AUB  SpO2 98%   BMI 21.20 kg/m   Physical Exam  Constitutional: She appears well-developed and well-nourished.  Musculoskeletal:  3mm flap laceration distal left 2nd finger tip, no bleeding, flap edges dark.   Neurological: She is alert.  Skin: Skin is warm.  Psychiatric: She has a normal mood and affect.  Nursing note and vitals reviewed.  Pt offered options of glue or steristips.  Pt is adamant that she wants sutures.  Pt advised sutures may not improve outcome as flap is superficial and mostly likely will scab over.    ED Treatments / Results  Labs (all labs ordered are listed, but only abnormal results are displayed) Labs Reviewed - No data to display  EKG None  Radiology No results found.  Procedures .Marland KitchenLaceration Repair Date/Time: 10/10/2017 8:27 PM Performed by: Elson Areas, PA-C Authorized by: Elson Areas, PA-C   Consent:    Consent obtained:  Verbal   Consent given by:  Patient   Risks discussed:  Infection, pain and poor wound healing   Alternatives discussed:  No treatment Universal protocol:    Procedure explained and questions answered to patient or proxy's satisfaction: yes     Relevant documents present and verified: yes     Imaging studies available: yes     Patient identity confirmed:  Verbally with patient Anesthesia (see MAR for exact dosages):    Anesthesia method:  Local infiltration   Local anesthetic:  Lidocaine 1% w/o epi Laceration details:    Location:  Finger   Finger location:  L ring finger   Length (cm):  3   Depth (mm):  1 Repair type:      Repair type:  Simple Pre-procedure details:    Preparation:  Patient was prepped and draped in usual sterile fashion Exploration:    Contaminated: no   Treatment:    Area cleansed with:  Betadine   Amount of cleaning:  Standard   Irrigation solution:  Sterile saline Skin repair:    Repair method:  Sutures   Suture size:  5-0   Suture technique:  Simple interrupted   Number of sutures:  2 Approximation:    Approximation:  Close Post-procedure details:    Dressing:  Bulky dressing   Patient tolerance of procedure:  Tolerated well, no immediate complications   (including critical care time)  Medications  Ordered in ED Medications - No data to display   Initial Impression / Assessment and Plan / ED Course  I have reviewed the triage vital signs and the nursing notes.  Pertinent labs & imaging results that were available during my care of the patient were reviewed by me and considered in my medical decision making (see chart for details).     Pt request pain medication.  [place] database reviewed.   Final Clinical Impressions(s) / ED Diagnoses   Final diagnoses:  Laceration of left index finger without damage to nail, foreign body presence unspecified, initial encounter    ED Discharge Orders        Ordered    HYDROcodone-acetaminophen (NORCO/VICODIN) 5-325 MG tablet  Every 4 hours PRN     10/10/17 2001    An After Visit Summary was printed and given to the patient.    Osie CheeksSofia, Leslie K, PA-C 10/10/17 2030    Loren RacerYelverton, David, MD 10/13/17 [phone]

## 2017-10-10 NOTE — ED Notes (Signed)
Pt declined discharge vitals

## 2017-10-10 NOTE — ED Triage Notes (Signed)
Pt cut second finger on left hand w knife.  She has been attempting to get the bleeding to stop for four hours.

## 2017-10-10 NOTE — Discharge Instructions (Addendum)
See your Physician for suture removal in 8 days.  Follow up with your Physician for recheck of your back.

## 2017-10-19 ENCOUNTER — Encounter (HOSPITAL_COMMUNITY): Payer: Self-pay | Admitting: Emergency Medicine

## 2017-10-19 ENCOUNTER — Ambulatory Visit (HOSPITAL_COMMUNITY)
Admission: EM | Admit: 2017-10-19 | Discharge: 2017-10-19 | Disposition: A | Discharge status: Home / Self Care | Payer: BLUE CROSS/BLUE SHIELD

## 2017-10-19 DIAGNOSIS — Z4802 Encounter for removal of sutures: Secondary | ICD-10-CM

## 2017-10-19 DIAGNOSIS — S61221A Laceration with foreign body of left index finger without damage to nail, initial encounter: Secondary | ICD-10-CM

## 2017-10-19 NOTE — ED Triage Notes (Signed)
Pt here for suture removal from finger; 2 sutures removed and wound approximated at present; sterile bandage applied

## 2017-10-21 DIAGNOSIS — F322 Major depressive disorder, single episode, severe without psychotic features: Secondary | ICD-10-CM | POA: Diagnosis not present

## 2017-10-29 ENCOUNTER — Encounter: Payer: Self-pay | Admitting: Endocrinology

## 2017-11-05 ENCOUNTER — Encounter: Payer: Self-pay | Admitting: Endocrinology

## 2017-11-05 ENCOUNTER — Ambulatory Visit (INDEPENDENT_AMBULATORY_CARE_PROVIDER_SITE_OTHER)
Admission: RE | Admit: 2017-11-05 | Discharge: 2017-11-05 | Disposition: A | Discharge status: Home / Self Care | Payer: BLUE CROSS/BLUE SHIELD | Source: Ambulatory Visit | Attending: Endocrinology | Admitting: Endocrinology

## 2017-11-05 ENCOUNTER — Other Ambulatory Visit: Payer: BLUE CROSS/BLUE SHIELD

## 2017-11-05 ENCOUNTER — Ambulatory Visit: Payer: BLUE CROSS/BLUE SHIELD | Admitting: Endocrinology

## 2017-11-05 VITALS — BP 102/68 | HR 93 | Ht 71.0 in | Wt 172.0 lb

## 2017-11-05 DIAGNOSIS — Z780 Asymptomatic menopausal state: Secondary | ICD-10-CM

## 2017-11-05 DIAGNOSIS — Z0000 Encounter for general adult medical examination without abnormal findings: Secondary | ICD-10-CM | POA: Diagnosis not present

## 2017-11-05 DIAGNOSIS — R635 Abnormal weight gain: Secondary | ICD-10-CM | POA: Insufficient documentation

## 2017-11-05 LAB — CBC WITH DIFFERENTIAL/PLATELET
BASOS ABS: 0 10*3/uL (ref 0.0–0.1)
Basophils Relative: 0.7 % (ref 0.0–3.0)
Eosinophils Absolute: 0.2 10*3/uL (ref 0.0–0.7)
Eosinophils Relative: 4.3 % (ref 0.0–5.0)
HCT: 35.7 % — ABNORMAL LOW (ref 36.0–46.0)
HEMOGLOBIN: 12 g/dL (ref 12.0–15.0)
Lymphocytes Relative: 29.1 % (ref 12.0–46.0)
Lymphs Abs: 1.3 10*3/uL (ref 0.7–4.0)
MCHC: 33.6 g/dL (ref 30.0–36.0)
MCV: 93.2 fl (ref 78.0–100.0)
MONO ABS: 0.6 10*3/uL (ref 0.1–1.0)
MONOS PCT: 14.5 % — AB (ref 3.0–12.0)
NEUTROS PCT: 51.4 % (ref 43.0–77.0)
Neutro Abs: 2.3 10*3/uL (ref 1.4–7.7)
Platelets: 158 10*3/uL (ref 150.0–400.0)
RBC: 3.83 Mil/uL — AB (ref 3.87–5.11)
RDW: 15.3 % (ref 11.5–15.5)
WBC: 4.5 10*3/uL (ref 4.0–10.5)

## 2017-11-05 LAB — HEPATIC FUNCTION PANEL
ALK PHOS: 54 U/L (ref 39–117)
ALT: 107 U/L — AB (ref 0–35)
AST: 76 U/L — AB (ref 0–37)
Albumin: 4.2 g/dL (ref 3.5–5.2)
BILIRUBIN TOTAL: 0.5 mg/dL (ref 0.2–1.2)
Bilirubin, Direct: 0.1 mg/dL (ref 0.0–0.3)
Total Protein: 7.1 g/dL (ref 6.0–8.3)

## 2017-11-05 LAB — URINALYSIS, ROUTINE W REFLEX MICROSCOPIC
BILIRUBIN URINE: NEGATIVE
HGB URINE DIPSTICK: NEGATIVE
Ketones, ur: NEGATIVE
NITRITE: NEGATIVE
RBC / HPF: NONE SEEN (ref 0–?)
Specific Gravity, Urine: <=1.005 — AB (ref 1.000–1.030)
Total Protein, Urine: NEGATIVE
Urine Glucose: NEGATIVE
Urobilinogen, UA: 0.2 (ref 0.0–1.0)
pH: 6.5 (ref 5.0–8.0)

## 2017-11-05 LAB — TSH: TSH: 0.13 u[IU]/mL — AB (ref 0.35–4.50)

## 2017-11-05 LAB — BASIC METABOLIC PANEL
BUN: 16 mg/dL (ref 6–23)
CALCIUM: 9.2 mg/dL (ref 8.4–10.5)
CO2: 31 mEq/L (ref 19–32)
Chloride: 102 mEq/L (ref 96–112)
Creatinine, Ser: 0.77 mg/dL (ref 0.40–1.20)
GFR: 83.94 mL/min (ref >60.00)
Glucose, Bld: 98 mg/dL (ref 70–99)
POTASSIUM: 4 meq/L (ref 3.5–5.1)
SODIUM: 138 meq/L (ref 135–145)

## 2017-11-05 LAB — LIPID PANEL
CHOLESTEROL: 189 mg/dL (ref 0–200)
HDL: 106.4 mg/dL (ref >39.00)
LDL Cholesterol: 75 mg/dL (ref 0–99)
NonHDL: 82.78
TRIGLYCERIDES: 38 mg/dL (ref 0.0–149.0)
Total CHOL/HDL Ratio: 2
VLDL: 7.6 mg/dL (ref 0.0–40.0)

## 2017-11-05 MED ORDER — LEVOTHYROXINE SODIUM 150 MCG PO TABS: 150.0000 ug | ORAL_TABLET | Freq: Every day | ORAL | 3 refills | Status: DC

## 2017-11-05 MED ORDER — GABAPENTIN 100 MG PO CAPS: 100.0000 mg | ORAL_CAPSULE | Freq: Three times a day (TID) | ORAL | 3 refills | Status: DC

## 2017-11-05 NOTE — Patient Instructions (Addendum)
I have sent a prescription to your pharmacy, to help the pain.  This medication can increase the effects of the alprazolam and/or ambien, so you should minimize these.   blood tests are requested for you today.  We'll let you know about the results.  Please see a weight loss specialist.  you will receive a phone call, about a day and time for an appointment. Please consider these measures for your health:  minimize alcohol.  Do not use tobacco products.  Have a colonoscopy at least every 10 years from age 51.  Women should have an annual mammogram from age 51.  Keep firearms safely stored.  Always use seat belts.  have working smoke alarms in your home.  See an eye doctor and dentist regularly.  Never drive under the influence of alcohol or drugs (including prescription drugs).  Those with fair skin should take precautions against the sun, and should carefully examine their skin once per month, for any new or changed moles.

## 2017-11-05 NOTE — Progress Notes (Signed)
Subjective:    Patient ID: Alyssa Garner, female    DOB: [birth year], 52 y.o.   MRN: [id number]  HPI Pt is here for regular wellness examination, and is feeling pretty well in general, and says chronic med probs are stable, except as noted below Past Medical History:  Diagnosis Date  . Anxiety   . Arthritis    "neck" (06/25/2015)  . Complication of anesthesia 1997   pt states with her first CS she had horrible pain and felt bad; "like it didn't kick in"  . Depression   . ETOH abuse   . GERD (gastroesophageal reflux disease)    occ OTC  . Heart murmur   . HYPOTHYROIDISM 04/09/2008  . Infection, kidney 1997   "hospitalized for 1 wk"  . Long-term current use of steroids   . LOW BACK PAIN 12/13/2006  . Osteoporosis    no treatment    Past Surgical History:  Procedure Laterality Date  . ABDOMINAL HYSTERECTOMY  10/2014   "left my ovaries"  . AUGMENTATION MAMMAPLASTY Bilateral ~ 1989; 2012  . CESAREAN SECTION  1998; 2000; 2007  . TUBAL LIGATION  2007    Social History   Socioeconomic History  . Marital status: Married    Spouse name: Not on file  . Number of children: 3  . Years of education: Not on file  . Highest education level: Not on file  Occupational History    Employer: [employer]  Social Needs  . Financial resource strain: Not on file  . Food insecurity:    Worry: Not on file    Inability: Not on file  . Transportation needs:    Medical: Not on file    Non-medical: Not on file  Tobacco Use  . Smoking status: Former Smoker    Packs/day: 0.25    Years: 10.00    Pack years: 2.50    Types: Cigarettes  . Smokeless tobacco: Never Used  . Tobacco comment: "quit smoking cigarettes in ~ 1990"  Substance and Sexual Activity  . Alcohol use: Yes    Comment: 06/25/2015 "stopped 06/19/2015"  . Drug use: No  . Sexual activity: Yes    Birth control/protection: Surgical  Lifestyle  . Physical activity:    Days per week: Not on file    Minutes per  session: Not on file  . Stress: Not on file  Relationships  . Social connections:    Talks on phone: Not on file    Gets together: Not on file    Attends religious service: Not on file    Active member of club or organization: Not on file    Attends meetings of clubs or organizations: Not on file    Relationship status: Not on file  . Intimate partner violence:    Fear of current or ex partner: Not on file    Emotionally abused: Not on file    Physically abused: Not on file    Forced sexual activity: Not on file  Other Topics Concern  . Not on file  Social History Narrative  . Not on file    Current Outpatient Medications on File Prior to Visit  Medication Sig Dispense Refill  . ALPRAZolam (XANAX) 0.5 MG tablet TAKE ONE TABLET BY MOUTH THREE TIMES A DAY AS NEEDED FOR ANXIETY 90 tablet 4  . ibuprofen (ADVIL,MOTRIN) 600 MG tablet Take 1 tablet (600 mg total) by mouth every 8 (eight) hours as needed. 90 tablet 11  . Multiple  Vitamin (MULTIVITAMIN WITH MINERALS) TABS tablet Take 1 tablet by mouth daily. Reported on 07/10/2015    . Probiotic Product (PROBIOTIC DAILY PO) Take 1 capsule by mouth daily. Reported on 07/10/2015    . zolpidem (AMBIEN) 10 MG tablet TAKE ONE TABLET BY MOUTH EVERY NIGHT AT BEDTIME AS NEEDED FOR SLEEP 30 tablet 0  . tretinoin (RETIN-A) 0.025 % cream Apply topically at bedtime. (Patient not taking: Reported on 11/05/2017) 45 g 11   No current facility-administered medications on file prior to visit.     Allergies  Allergen Reactions  . Trazodone And Nefazodone     nightmares    Family History  Adopted: Yes  Family history unknown: Yes    BP 102/68 (BP Location: Right Arm, Patient Position: Sitting, Cuff Size: Normal)   Pulse 93   Ht 5\' 11"  (1.803 m)   Wt 172 lb (78 kg)   LMP  (LMP Unknown) Comment: AUB  SpO2 98%   BMI 23.99 kg/m     Review of Systems She has gained weight.  Back pain persists.  She has fatigue.  She denies fever, visual loss,  hearing loss, chest pain, sob, depression, cold intolerance, BRBPR, hematuria, syncope, numbness, allergy sxs, easy bruising, and rash.  Depression is well-controlled.      Objective:   Physical Exam VS: see vs page GEN: no distress HEAD: head: no deformity eyes: no periorbital swelling, no proptosis external nose and ears are normal mouth: no lesion seen NECK: supple, thyroid is not enlarged CHEST WALL: no deformity LUNGS: clear to auscultation CV: reg rate and rhythm, no murmur ABD: abdomen is soft, nontender.  no hepatosplenomegaly.  not distended.  no hernia.   MUSCULOSKELETAL: muscle bulk and strength are grossly normal.  no obvious joint swelling.  gait is normal and steady.  Old healed surgical scar at the back--midline.   EXTEMITIES: no deformity.  no ulcer on the feet.  feet are of normal color and temp.  no edema PULSES: dorsalis pedis intact bilat.  no carotid bruit NEURO:  cn 2-12 grossly intact.   readily moves all 4's.  sensation is intact to touch on the feet SKIN:  Normal texture and temperature.  No rash or suspicious lesion is visible.   NODES:  None palpable at the neck PSYCH: alert, well-oriented.  Does not appear anxious nor depressed.  I personally reviewed electrocardiogram tracing (today): Indication: wellness Impression: NSR.  No MI.  No hypertrophy. Compared to 2018: no significant change      Assessment & Plan:  Wellness visit today, with problems stable, except as noted.  cologuard form is done Back pain, persistent.  Patient Instructions  I have sent a prescription to your pharmacy, to help the pain.  This medication can increase the effects of the alprazolam and/or ambien, so you should minimize these.   blood tests are requested for you today.  We'll let you know about the results.  Please see a weight loss specialist.  you will receive a phone call, about a day and time for an appointment. Please consider these measures for your health:  minimize  alcohol.  Do not use tobacco products.  Have a colonoscopy at least every 10 years from age 51.  Women should have an annual mammogram from age 51.  Keep firearms safely stored.  Always use seat belts.  have working smoke alarms in your home.  See an eye doctor and dentist regularly.  Never drive under the influence of alcohol or drugs (including  prescription drugs).  Those with fair skin should take precautions against the sun, and should carefully examine their skin once per month, for any new or changed moles.

## 2017-11-07 DIAGNOSIS — Z780 Asymptomatic menopausal state: Secondary | ICD-10-CM | POA: Diagnosis not present

## 2017-11-08 ENCOUNTER — Encounter: Payer: Self-pay | Admitting: Endocrinology

## 2017-11-10 ENCOUNTER — Telehealth: Payer: Self-pay

## 2017-11-10 NOTE — Telephone Encounter (Signed)
Patient called today and stated Medical Weight management told her that her BMI was not high enough- patient wants to know if there is anywhere else she can go or if there is any medication she can take please advise

## 2017-11-11 ENCOUNTER — Encounter: Payer: Self-pay | Admitting: Endocrinology

## 2017-11-11 ENCOUNTER — Encounter (INDEPENDENT_AMBULATORY_CARE_PROVIDER_SITE_OTHER): Payer: Self-pay

## 2017-12-07 DIAGNOSIS — H9203 Otalgia, bilateral: Secondary | ICD-10-CM | POA: Diagnosis not present

## 2017-12-07 DIAGNOSIS — H903 Sensorineural hearing loss, bilateral: Secondary | ICD-10-CM | POA: Diagnosis not present

## 2017-12-07 DIAGNOSIS — Z87891 Personal history of nicotine dependence: Secondary | ICD-10-CM | POA: Diagnosis not present

## 2017-12-07 DIAGNOSIS — R6884 Jaw pain: Secondary | ICD-10-CM | POA: Diagnosis not present

## 2017-12-09 ENCOUNTER — Telehealth: Payer: Self-pay | Admitting: Endocrinology

## 2017-12-09 ENCOUNTER — Other Ambulatory Visit: Payer: Self-pay | Admitting: Physician Assistant

## 2017-12-09 ENCOUNTER — Telehealth: Payer: Self-pay

## 2017-12-09 DIAGNOSIS — H903 Sensorineural hearing loss, bilateral: Secondary | ICD-10-CM | POA: Diagnosis not present

## 2017-12-09 DIAGNOSIS — H9203 Otalgia, bilateral: Secondary | ICD-10-CM

## 2017-12-09 NOTE — Telephone Encounter (Signed)
Patient is scheduled per Dr. Everardo All

## 2017-12-09 NOTE — Telephone Encounter (Signed)
tomorrow am at 10:15 AM

## 2017-12-09 NOTE — Telephone Encounter (Signed)
Patient is experiencing sever pain from headaches but has been unable to see a doctor to set up new primary care- patient is requesting MD send in prescription for pain medication until she can find a PCP- please advise

## 2017-12-09 NOTE — Telephone Encounter (Signed)
Pt called stating since Everardo All is still her primary she would like a referral for her current head pain. Will be here 12/13/17 for physical.  Please advice ph# [phone]

## 2017-12-10 ENCOUNTER — Ambulatory Visit: Payer: Self-pay | Admitting: Endocrinology

## 2017-12-10 DIAGNOSIS — Z0289 Encounter for other administrative examinations: Secondary | ICD-10-CM

## 2017-12-13 ENCOUNTER — Encounter: Payer: Self-pay | Admitting: Endocrinology

## 2017-12-13 DIAGNOSIS — Z0289 Encounter for other administrative examinations: Secondary | ICD-10-CM

## 2017-12-21 ENCOUNTER — Other Ambulatory Visit: Payer: Self-pay

## 2017-12-21 DIAGNOSIS — Z6823 Body mass index (BMI) 23.0-23.9, adult: Secondary | ICD-10-CM | POA: Diagnosis not present

## 2017-12-21 DIAGNOSIS — Z1231 Encounter for screening mammogram for malignant neoplasm of breast: Secondary | ICD-10-CM | POA: Diagnosis not present

## 2017-12-21 DIAGNOSIS — Z01419 Encounter for gynecological examination (general) (routine) without abnormal findings: Secondary | ICD-10-CM | POA: Diagnosis not present

## 2017-12-21 DIAGNOSIS — N951 Menopausal and female climacteric states: Secondary | ICD-10-CM | POA: Diagnosis not present

## 2017-12-21 MED ORDER — LEVOTHYROXINE SODIUM 150 MCG PO TABS: 150.0000 ug | ORAL_TABLET | Freq: Every day | ORAL | 3 refills | Status: AC

## 2018-01-27 DIAGNOSIS — F322 Major depressive disorder, single episode, severe without psychotic features: Secondary | ICD-10-CM | POA: Diagnosis not present

## 2018-03-31 DIAGNOSIS — F4321 Adjustment disorder with depressed mood: Secondary | ICD-10-CM | POA: Diagnosis not present

## 2018-04-07 DIAGNOSIS — F4321 Adjustment disorder with depressed mood: Secondary | ICD-10-CM | POA: Diagnosis not present

## 2018-04-28 DIAGNOSIS — F322 Major depressive disorder, single episode, severe without psychotic features: Secondary | ICD-10-CM | POA: Diagnosis not present

## 2018-07-21 DIAGNOSIS — F322 Major depressive disorder, single episode, severe without psychotic features: Secondary | ICD-10-CM | POA: Diagnosis not present

## 2018-08-22 DIAGNOSIS — R635 Abnormal weight gain: Secondary | ICD-10-CM | POA: Diagnosis not present

## 2018-08-22 DIAGNOSIS — N644 Mastodynia: Secondary | ICD-10-CM | POA: Diagnosis not present

## 2018-08-24 ENCOUNTER — Other Ambulatory Visit: Payer: Self-pay | Admitting: Obstetrics and Gynecology

## 2018-08-24 DIAGNOSIS — N6320 Unspecified lump in the left breast, unspecified quadrant: Secondary | ICD-10-CM

## 2018-09-09 ENCOUNTER — Ambulatory Visit (HOSPITAL_COMMUNITY)
Admission: EM | Admit: 2018-09-09 | Discharge: 2018-09-09 | Disposition: A | Discharge status: Home / Self Care | Payer: BLUE CROSS/BLUE SHIELD | Attending: Family Medicine | Admitting: Family Medicine

## 2018-09-09 ENCOUNTER — Encounter (HOSPITAL_COMMUNITY): Payer: Self-pay | Admitting: Emergency Medicine

## 2018-09-09 ENCOUNTER — Other Ambulatory Visit: Payer: Self-pay

## 2018-09-09 ENCOUNTER — Ambulatory Visit (INDEPENDENT_AMBULATORY_CARE_PROVIDER_SITE_OTHER): Payer: BLUE CROSS/BLUE SHIELD

## 2018-09-09 DIAGNOSIS — S52591A Other fractures of lower end of right radius, initial encounter for closed fracture: Secondary | ICD-10-CM

## 2018-09-09 MED ORDER — HYDROCODONE-ACETAMINOPHEN 5-325 MG PO TABS
ORAL_TABLET | ORAL | Status: AC
  Filled 2018-09-09: qty 1

## 2018-09-09 MED ORDER — HYDROCODONE-ACETAMINOPHEN 5-325 MG PO TABS
1.0000 | ORAL_TABLET | Freq: Once | ORAL | Status: AC
  Administered 2018-09-09: 1 via ORAL

## 2018-09-09 MED ORDER — HYDROCODONE-ACETAMINOPHEN 7.5-325 MG PO TABS: 1.0000 | ORAL_TABLET | Freq: Four times a day (QID) | ORAL | 0 refills | Status: DC | PRN

## 2018-09-09 NOTE — Discharge Instructions (Signed)
Take OTC medicine for moderate pain Take the hydrocodone for severe pain Do not drive on hydrocodone ELEVATE arm and use ice to prevent swelling Call Orthopedic office TODAY to set appointment for the day after you get back

## 2018-09-09 NOTE — ED Triage Notes (Signed)
PT reports she fell yesterday and caught herself with her right wrist. PT reports severe right wrist pain.

## 2018-09-09 NOTE — Progress Notes (Signed)
Orthopedic Tech Progress Note Patient Details:  Alyssa Garner 1966-01-11 [id number] Patient was in a lot of discomfort before I applied the cast. I really did try my best with her Casting Type of Cast: Short arm cast Cast Location: URE Cast Material: Fiberglass Cast Intervention: Application  Post Interventions Patient Tolerated: Fair Instructions Provided: Care of device, Adjustment of device     Janit Pagan 09/09/2018, 3:40 PM

## 2018-09-09 NOTE — ED Provider Notes (Signed)
[facility]    CSN: [id number] Arrival date & time: 09/09/18  1315     History   Chief Complaint Chief Complaint  Patient presents with  . Wrist Pain    HPI Alyssa Garner is a 53 y.o. female.   HPI  Patient had a mechanical fall yesterday.  Landed on outstretched right hand.  He has pain in her wrist.  Describes it as "severe".  Is in a brace.  He has a history of low bone density.  Is not on any medication for this.  Status post hysterectomy 2016.  Has normal feeling in her hands and ability to use her fingers.  Past Medical History:  Diagnosis Date  . Anxiety   . Arthritis    "neck" (06/25/2015)  . Complication of anesthesia 1997   pt states with her first CS she had horrible pain and felt bad; "like it didn't kick in"  . Depression   . ETOH abuse   . GERD (gastroesophageal reflux disease)    occ OTC  . Heart murmur   . HYPOTHYROIDISM 04/09/2008  . Infection, kidney 1997   "hospitalized for 1 wk"  . Long-term current use of steroids   . LOW BACK PAIN 12/13/2006  . Osteoporosis    no treatment    Patient Active Problem List   Diagnosis Date Noted  . Weight gain 11/05/2017  . Asymptomatic menopausal state 11/05/2017  . Adjustment disorder with mixed anxiety and depressed mood 07/01/2015  . Burst fracture of lumbar vertebra ([place]) 06/25/2015  . Contusion of lower back 06/21/2015  . Fracture of lumbar spine ([place]) 06/21/2015  . Hyponatremia 05/14/2015  . Postmenopausal bleeding 11/29/2014  . S/P Davinci total laparoscopic hysterectomy with bilateral salpingectomy 11/29/2014  . Sinus disorder 04/18/2014  . Allergic rhinitis, cause unspecified 11/23/2011  . Cervical lymphadenitis 11/23/2011  . Disturbance of skin sensation 06/10/2010  . Routine general medical examination at a health care facility 06/10/2010  . OTHER ACNE 09/22/2009  . LYMPHADENOPATHY 06/13/2009  . Hypothyroidism 04/09/2008  . LOW BACK PAIN 12/13/2006    Past Surgical History:   Procedure Laterality Date  . ABDOMINAL HYSTERECTOMY  10/2014   "left my ovaries"  . AUGMENTATION MAMMAPLASTY Bilateral ~ 1989; 2012  . BACK SURGERY    . [place]; 2000; 2007  . TUBAL LIGATION  2007    OB History   No obstetric history on file.      Home Medications    Prior to Admission medications   Medication Sig Start Date End Date Taking? Authorizing Provider  ALPRAZolam Duanne Moron) 0.5 MG tablet TAKE ONE TABLET BY MOUTH THREE TIMES A DAY AS NEEDED FOR ANXIETY 09/14/16  Yes Renato Shin, MD  ibuprofen (ADVIL,MOTRIN) 600 MG tablet Take 1 tablet (600 mg total) by mouth every 8 (eight) hours as needed. 07/10/15  Yes Renato Shin, MD  levothyroxine (SYNTHROID, LEVOTHROID) 150 MCG tablet Take 1 tablet (150 mcg total) by mouth daily before breakfast. 12/21/17  Yes Renato Shin, MD  traZODone (DESYREL) 50 MG tablet Take 50 mg by mouth at bedtime.   Yes [provider]  venlafaxine XR (EFFEXOR-XR) 150 MG 24 hr capsule Take 150 mg by mouth daily with breakfast.   Yes [provider]  zolpidem (AMBIEN) 10 MG tablet TAKE ONE TABLET BY MOUTH EVERY NIGHT AT BEDTIME AS NEEDED FOR SLEEP 03/22/17  Yes Renato Shin, MD  gabapentin (NEURONTIN) 100 MG capsule Take 1 capsule (100 mg total) by mouth 3 (three) times  daily. 11/05/17   Romero BellingEllison, Sean, MD  HYDROcodone-acetaminophen [facility](NORCO) 7.5-325 MG tablet Take 1 tablet by mouth every 6 (six) hours as needed for moderate pain. 09/09/18   Eustace MooreNelson, Yvonne Sue, MD  Multiple Vitamin (MULTIVITAMIN WITH MINERALS) TABS tablet Take 1 tablet by mouth daily. Reported on 07/10/2015    [provider]  Probiotic Product (PROBIOTIC DAILY PO) Take 1 capsule by mouth daily. Reported on 07/10/2015    [provider]  tretinoin (RETIN-A) 0.025 % cream Apply topically at bedtime. Patient not taking: Reported on 11/05/2017 09/15/16   Romero BellingEllison, Sean, MD    Family History Family History  Adopted: Yes  Family history unknown: Yes     Social History Social History   Tobacco Use  . Smoking status: Former Smoker    Packs/day: 0.25    Years: 10.00    Pack years: 2.50    Types: Cigarettes  . Smokeless tobacco: Never Used  . Tobacco comment: "quit smoking cigarettes in ~ 1990"  Substance Use Topics  . Alcohol use: Yes    Comment: 06/25/2015 "stopped 06/19/2015"  . Drug use: No     Allergies   Trazodone and nefazodone   Review of Systems Review of Systems  Constitutional: Negative for chills and fever.  HENT: Negative for ear pain and sore throat.   Eyes: Negative for pain and visual disturbance.  Respiratory: Negative for cough and shortness of breath.   Cardiovascular: Negative for chest pain and palpitations.  Gastrointestinal: Negative for abdominal pain and vomiting.  Genitourinary: Negative for dysuria and hematuria.  Musculoskeletal: Positive for arthralgias. Negative for back pain.  Skin: Negative for color change and rash.  Neurological: Negative for seizures and syncope.  All other systems reviewed and are negative.    Physical Exam Triage Vital Signs ED Triage Vitals  Enc Vitals Group     BP --      Pulse Rate 09/09/18 1333 (!) 105     Resp 09/09/18 1333 16     Temp --      Temp Source 09/09/18 1333 Oral     SpO2 09/09/18 1333 96 %     Weight --      Height --      Head Circumference --      Peak Flow --      Pain Score 09/09/18 1329 8     Pain Loc --      Pain Edu? --      Excl. in GC? --    No data found.  Updated Vital Signs BP 120/82   Pulse (!) 105   Resp 16   LMP  (LMP Unknown) Comment: AUB  SpO2 96%   Visual Acuity Right Eye Distance:   Left Eye Distance:   Bilateral Distance:    Right Eye Near:   Left Eye Near:    Bilateral Near:     Physical Exam Constitutional:      General: She is not in acute distress.    Appearance: She is well-developed.     Comments: Depressed demeanor.  Appears tired  HENT:     Head: Normocephalic and atraumatic.  Eyes:      Conjunctiva/sclera: Conjunctivae normal.     Pupils: Pupils are equal, round, and reactive to light.  Neck:     Musculoskeletal: Normal range of motion.  Cardiovascular:     Rate and Rhythm: Normal rate.  Pulmonary:     Effort: Pulmonary effort is normal. No respiratory distress.  Abdominal:  General: There is no distension.     Palpations: Abdomen is soft.  Musculoskeletal: Normal range of motion.     Comments: Right wrist has some early bruising.  Mild soft tissue swelling.  Tenderness over the distal radius, and to lesser degree the illness.  No scaphoid tenderness.  She has about 50% range of motion to flexion extension.  Distal neurovascular exam is completely normal  Skin:    General: Skin is warm and dry.  Neurological:     General: No focal deficit present.     Mental Status: She is alert.  Psychiatric:        Behavior: Behavior normal.      UC Treatments / Results  Labs (all labs ordered are listed, but only abnormal results are displayed) Labs Reviewed - No data to display  EKG None  Radiology Dg Wrist Complete Right  Result Date: 09/09/2018 CLINICAL DATA:  Fall. EXAM: RIGHT WRIST - COMPLETE 3+ VIEW COMPARISON:  No recent prior. FINDINGS: Nondisplaced fracture of the distal radial metaphysis is noted. No other focal abnormality identified. Soft tissues are normal. IMPRESSION: Nondisplaced fracture of the distal radial metaphysis is noted. Electronically Signed   By: Maisie Fushomas  Register   On: 09/09/2018 14:17    Procedures Procedures (including critical care time)  Medications Ordered in UC Medications  HYDROcodone-acetaminophen (NORCO/VICODIN) 5-325 MG per tablet 1 tablet (1 tablet Oral Given 09/09/18 1356)  HYDROcodone-acetaminophen (NORCO/VICODIN) 5-325 MG per tablet (has no administration in time range)    Initial Impression / Assessment and Plan / UC Course  I have reviewed the triage vital signs and the nursing notes.  Pertinent labs & imaging results  that were available during my care of the patient were reviewed by me and considered in my medical decision making (see chart for details).     Discussed nondisplaced Fx.  Needs to see Ortho.  Is leaving on a vaca to celebrate her [age] anniv this weekend.  I called the ortho on call and Dr Benito Mccreedy [place] kindly looked at films and authorized a short arm cast, and to see her in a week Final Clinical Impressions(s) / UC Diagnoses   Final diagnoses:  Other closed fracture of distal end of right radius, initial encounter     Discharge Instructions     Take OTC medicine for moderate pain Take the hydrocodone for severe pain Do not drive on hydrocodone ELEVATE arm and use ice to prevent swelling Call Orthopedic office TODAY to set appointment for the day after you get back    ED Prescriptions    Medication Sig Dispense Auth. Provider   HYDROcodone-acetaminophen (NORCO) 7.5-325 MG tablet Take 1 tablet by mouth every 6 (six) hours as needed for moderate pain. 15 tablet Eustace MooreNelson, Yvonne Sue, MD     Controlled Substance Prescriptions [place] Controlled Substance Registry consulted? Yes, I have consulted the [place] Controlled Substances Registry for this patient, and feel the risk/benefit ratio today is favorable for proceeding with this prescription for a controlled substance.  She has no history of opioid use but is on benzodiazepines.  He is cautioned not to take them together   Eustace MooreNelson, Yvonne Sue, MD 09/09/18 1525

## 2018-09-19 DIAGNOSIS — M25531 Pain in right wrist: Secondary | ICD-10-CM | POA: Diagnosis not present

## 2018-09-27 DIAGNOSIS — S52551A Other extraarticular fracture of lower end of right radius, initial encounter for closed fracture: Secondary | ICD-10-CM | POA: Diagnosis not present

## 2018-10-11 DIAGNOSIS — S52551D Other extraarticular fracture of lower end of right radius, subsequent encounter for closed fracture with routine healing: Secondary | ICD-10-CM | POA: Diagnosis not present

## 2018-10-17 ENCOUNTER — Emergency Department (HOSPITAL_COMMUNITY): Payer: BC Managed Care – PPO

## 2018-10-17 ENCOUNTER — Encounter (HOSPITAL_COMMUNITY): Payer: Self-pay | Admitting: Emergency Medicine

## 2018-10-17 ENCOUNTER — Emergency Department (HOSPITAL_COMMUNITY)
Admission: EM | Admit: 2018-10-17 | Discharge: 2018-10-17 | Disposition: A | Discharge status: Home / Self Care | Payer: BC Managed Care – PPO | Attending: Emergency Medicine | Admitting: Emergency Medicine

## 2018-10-17 ENCOUNTER — Other Ambulatory Visit: Payer: Self-pay

## 2018-10-17 DIAGNOSIS — R0902 Hypoxemia: Secondary | ICD-10-CM | POA: Diagnosis not present

## 2018-10-17 DIAGNOSIS — E039 Hypothyroidism, unspecified: Secondary | ICD-10-CM | POA: Diagnosis not present

## 2018-10-17 DIAGNOSIS — S99811A Other specified injuries of right ankle, initial encounter: Secondary | ICD-10-CM | POA: Diagnosis not present

## 2018-10-17 DIAGNOSIS — Z87891 Personal history of nicotine dependence: Secondary | ICD-10-CM | POA: Insufficient documentation

## 2018-10-17 DIAGNOSIS — S82891A Other fracture of right lower leg, initial encounter for closed fracture: Secondary | ICD-10-CM

## 2018-10-17 DIAGNOSIS — Y999 Unspecified external cause status: Secondary | ICD-10-CM | POA: Insufficient documentation

## 2018-10-17 DIAGNOSIS — Z79899 Other long term (current) drug therapy: Secondary | ICD-10-CM | POA: Insufficient documentation

## 2018-10-17 DIAGNOSIS — S82844A Nondisplaced bimalleolar fracture of right lower leg, initial encounter for closed fracture: Secondary | ICD-10-CM | POA: Insufficient documentation

## 2018-10-17 DIAGNOSIS — X58XXXA Exposure to other specified factors, initial encounter: Secondary | ICD-10-CM | POA: Diagnosis not present

## 2018-10-17 DIAGNOSIS — Y939 Activity, unspecified: Secondary | ICD-10-CM | POA: Insufficient documentation

## 2018-10-17 DIAGNOSIS — M25561 Pain in right knee: Secondary | ICD-10-CM | POA: Diagnosis not present

## 2018-10-17 DIAGNOSIS — I10 Essential (primary) hypertension: Secondary | ICD-10-CM | POA: Diagnosis not present

## 2018-10-17 DIAGNOSIS — R000 Tachycardia, unspecified: Secondary | ICD-10-CM | POA: Diagnosis not present

## 2018-10-17 DIAGNOSIS — Y929 Unspecified place or not applicable: Secondary | ICD-10-CM | POA: Diagnosis not present

## 2018-10-17 DIAGNOSIS — R52 Pain, unspecified: Secondary | ICD-10-CM | POA: Diagnosis not present

## 2018-10-17 MED ORDER — MORPHINE SULFATE (PF) 4 MG/ML IV SOLN
4.0000 mg | Freq: Once | INTRAVENOUS | Status: AC
Start: 2018-10-17 — End: 2018-10-17
  Administered 2018-10-17: 4 mg via INTRAVENOUS
  Filled 2018-10-17: qty 1

## 2018-10-17 NOTE — ED Notes (Signed)
Pt awaiting wheelchair from social work

## 2018-10-17 NOTE — ED Triage Notes (Signed)
Pt fell last night and injured right ankle around midnight. Does not recall how she injured it. EMS states she has an obvious deformity. EMS reports pt was very anxious en route and they had to give her 5mg  Haldol and 100 mcg fentanyl. EMS reports an empty bottle of alcohol, believes she was intoxicated and fell last night. Pt alert and oriented. BP 133/80, HR 120

## 2018-10-17 NOTE — TOC Transition Note (Signed)
Transition of Care [facility]) - CM/SW Discharge Note   Patient Details  Name: Alyssa Garner MRN: [id number] Date of Birth: 05/09/1965  [facility]) CM/SW Contact:  Fuller Mandril, RN Phone Number: 10/17/2018, 1:12 PM   Clinical Narrative:    Fuller Mandril, RN, BSN, NCM [phone] Pt qualifies for DME wheelchair.  DME  ordered through [place].  Zack Blank of [place] notified to deliver wheelchair to pt room prior to D/C home.    Final next level of care: Home/Self Care Barriers to Discharge: Equipment Delay   Patient Goals and CMS Choice Patient states their goals for this hospitalization and ongoing recovery are:: to have a way to get around the house.      Discharge Placement                       Discharge Plan and Services   Discharge Planning Services: CM Consult Post Acute Care Choice: Durable Medical Equipment          DME Arranged: Wheelchair manual DME Agency: [employer] Date DME Agency Contacted: 10/17/18 Time DME Agency Contacted: 55 Representative spoke with at DME Agency: [place] Determinants of Health ([place]) Interventions     Readmission Risk Interventions No flowsheet data found.

## 2018-10-17 NOTE — ED Notes (Signed)
Esignature not available. Pt agreeable to discharge instructions.

## 2018-10-17 NOTE — ED Notes (Signed)
Pt transported to Xray. 

## 2018-10-17 NOTE — ED Provider Notes (Signed)
Patient suffers from broken wrist, broken ankle which impairs their ability to perform daily activities like bathing, dressing, feeding, grooming and toileting in the home.  A cane or crutch will not resolve  issue with performing activities of daily living. A wheelchair will allow patient to safely perform daily activities. Patient is not able to propel themselves in the home using a standard weight wheelchair due to arm weakness, endurance and general weakness. Patient can self propel in the lightweight wheelchair. Length of need 6 months . Accessories: elevating leg rests (ELRs), wheel locks, extensions and anti-tippers.   Guadalupe Dawn, MD 10/17/18 [place], DO 10/17/18 1253

## 2018-10-17 NOTE — TOC Initial Note (Signed)
Transition of Care [facility]) - Initial/Assessment Note    Patient Details  Name: Alyssa Garner MRN: [id number] Date of Birth: 02/02/1966  [facility] CM/SW Contact:    Fuller Mandril, RN Phone Number: 10/17/2018, 1:08 PM  Clinical Narrative:                 [facility] consulted regarding DME for patient.  Expected Discharge Plan: Home/Self Care Barriers to Discharge: Equipment Delay   Patient Goals and CMS Choice Patient states their goals for this hospitalization and ongoing recovery are:: to have a way to get around the house.      Expected Discharge Plan and Services Expected Discharge Plan: Home/Self Care   Discharge Planning Services: CM Consult Post Acute Care Choice: Durable Medical Equipment Living arrangements for the past 2 months: Single Family Home Expected Discharge Date: 10/17/18               DME Arranged: Wheelchair manual DME Agency: [employer] Date DME Agency Contacted: 10/17/18 Time DME Agency Contacted: 26 Representative spoke with at DME Agency: Bethanne Ginger            Prior Living Arrangements/Services Living arrangements for the past 2 months: [place] with:: Adult Children Patient language and need for interpreter reviewed:: Yes Do you feel safe going back to the place where you live?: Yes      Need for Family Participation in Patient Care: Yes (Comment) Care giver support system in place?: Yes (comment)   Criminal Activity/Legal Involvement Pertinent to Current Situation/Hospitalization: No - Comment as needed  Activities of Daily Living      Permission Sought/Granted Permission sought to share information with : Case Manager, Customer service manager Permission granted to share information with : Yes, Verbal Permission Granted              Emotional Assessment Appearance:: Appears stated age Attitude/Demeanor/Rapport: Angry, Reactive Affect (typically observed): Agitated, Angry,  Blunt Orientation: : Oriented to Self, Oriented to Place, Oriented to  Time, Oriented to Situation Alcohol / Substance Use: Not Applicable Psych Involvement: No (comment)  Admission diagnosis:  ANKLE INJURY Patient Active Problem List   Diagnosis Date Noted  . Weight gain 11/05/2017  . Asymptomatic menopausal state 11/05/2017  . Adjustment disorder with mixed anxiety and depressed mood 07/01/2015  . Burst fracture of lumbar vertebra ([place]) 06/25/2015  . Contusion of lower back 06/21/2015  . Fracture of lumbar spine ([place]) 06/21/2015  . Hyponatremia 05/14/2015  . Postmenopausal bleeding 11/29/2014  . S/P Davinci total laparoscopic hysterectomy with bilateral salpingectomy 11/29/2014  . Sinus disorder 04/18/2014  . Allergic rhinitis, cause unspecified 11/23/2011  . Cervical lymphadenitis 11/23/2011  . Disturbance of skin sensation 06/10/2010  . Routine general medical examination at a health care facility 06/10/2010  . OTHER ACNE 09/22/2009  . LYMPHADENOPATHY 06/13/2009  . Hypothyroidism 04/09/2008  . LOW BACK PAIN 12/13/2006   PCP:  Patient, No Pcp Per Pharmacy:   [place], [place] [address] Phone: [phone] Fax: [phone]  [facility] [address], [place] [address] Phone: [phone] Fax: [phone]  [facility] [place], [place] [address] [place] [address] Phone: [phone] Fax: [phone]     Social Determinants of Health (SDOH) Interventions    Readmission Risk Interventions No flowsheet data  found.  

## 2018-10-17 NOTE — Discharge Planning (Signed)
DME wheelchair will be delivered to pt home as stock for pt size is not available in hospital at this time and pt does not want to wait.  Kathe Wirick J. Clydene Laming, [place], [place], [place]

## 2018-10-17 NOTE — ED Provider Notes (Addendum)
[facility] Provider Note   CSN: [id number] Arrival date & time: 10/17/18  0845    History   Chief Complaint Chief Complaint  Patient presents with  . Ankle Injury   Level 5 caveat due to mental status change 2/2 alcohol and pain meds  HPI Alyssa Garner is a 53 y.o. female who presents due to left ankle pain. Per EMS report patient felt that Alyssa Garner injured her ankle around midnight. Alyssa Garner was found beside an empty bottle of alcohol and was felt to be intoxicated. Alyssa Garner received 5mg  haldol and 100mcg fentanyl in route 2/2 to anxiety and agitation. Patient oriented to place and self during eval in ed. Does not feel that Alyssa Garner hurt anything else. Did note that Alyssa Garner broke her wrist back on 6/26 and has a cast in place.   Past Medical History:  Diagnosis Date  . Anxiety   . Arthritis    "neck" (06/25/2015)  . Complication of anesthesia 1997   pt states with her first CS Alyssa Garner had horrible pain and felt bad; "like it didn't kick in"  . Depression   . ETOH abuse   . GERD (gastroesophageal reflux disease)    occ OTC  . Heart murmur   . HYPOTHYROIDISM 04/09/2008  . Infection, kidney 1997   "hospitalized for 1 wk"  . Long-term current use of steroids   . LOW BACK PAIN 12/13/2006  . Osteoporosis    no treatment    Patient Active Problem List   Diagnosis Date Noted  . Weight gain 11/05/2017  . Asymptomatic menopausal state 11/05/2017  . Adjustment disorder with mixed anxiety and depressed mood 07/01/2015  . Burst fracture of lumbar vertebra (HCC) 06/25/2015  . Contusion of lower back 06/21/2015  . Fracture of lumbar spine (HCC) 06/21/2015  . Hyponatremia 05/14/2015  . Postmenopausal bleeding 11/29/2014  . S/P Davinci total laparoscopic hysterectomy with bilateral salpingectomy 11/29/2014  . Sinus disorder 04/18/2014  . Allergic rhinitis, cause unspecified 11/23/2011  . Cervical lymphadenitis 11/23/2011  . Disturbance of skin sensation 06/10/2010   . Routine general medical examination at a health care facility 06/10/2010  . OTHER ACNE 09/22/2009  . LYMPHADENOPATHY 06/13/2009  . Hypothyroidism 04/09/2008  . LOW BACK PAIN 12/13/2006    Past Surgical History:  Procedure Laterality Date  . ABDOMINAL HYSTERECTOMY  10/2014   "left my ovaries"  . AUGMENTATION MAMMAPLASTY Bilateral ~ 1989; 2012  . BACK SURGERY    . CESAREAN SECTION  1998; 2000; 2007  . TUBAL LIGATION  2007     OB History   No obstetric history on file.      Home Medications    Prior to Admission medications   Medication Sig Start Date End Date Taking? Authorizing Provider  ALPRAZolam Prudy Feeler(XANAX) 0.5 MG tablet TAKE ONE TABLET BY MOUTH THREE TIMES A DAY AS NEEDED FOR ANXIETY 09/14/16   Romero BellingEllison, Sean, MD  gabapentin (NEURONTIN) 100 MG capsule Take 1 capsule (100 mg total) by mouth 3 (three) times daily. 11/05/17   Romero BellingEllison, Sean, MD  HYDROcodone-acetaminophen (NORCO) 7.5-325 MG tablet Take 1 tablet by mouth every 6 (six) hours as needed for moderate pain. 09/09/18   Eustace MooreNelson, Yvonne Sue, MD  ibuprofen (ADVIL,MOTRIN) 600 MG tablet Take 1 tablet (600 mg total) by mouth every 8 (eight) hours as needed. 07/10/15   Romero BellingEllison, Sean, MD  levothyroxine (SYNTHROID, LEVOTHROID) 150 MCG tablet Take 1 tablet (150 mcg total) by mouth daily before breakfast. 12/21/17   Romero BellingEllison, Sean, MD  Multiple Vitamin (MULTIVITAMIN WITH MINERALS) TABS tablet Take 1 tablet by mouth daily. Reported on 07/10/2015    [provider]  Probiotic Product (PROBIOTIC DAILY PO) Take 1 capsule by mouth daily. Reported on 07/10/2015    [provider]  traZODone (DESYREL) 50 MG tablet Take 50 mg by mouth at bedtime.    [provider]  tretinoin (RETIN-A) 0.025 % cream Apply topically at bedtime. Patient not taking: Reported on 11/05/2017 09/15/16   Romero BellingEllison, Sean, MD  venlafaxine XR (EFFEXOR-XR) 150 MG 24 hr capsule Take 150 mg by mouth daily with breakfast.    [provider]  zolpidem  (AMBIEN) 10 MG tablet TAKE ONE TABLET BY MOUTH EVERY NIGHT AT BEDTIME AS NEEDED FOR SLEEP 03/22/17   Romero BellingEllison, Sean, MD    Family History Family History  Adopted: Yes  Family history unknown: Yes    Social History Social History   Tobacco Use  . Smoking status: Former Smoker    Packs/day: 0.25    Years: 10.00    Pack years: 2.50    Types: Cigarettes  . Smokeless tobacco: Never Used  . Tobacco comment: "quit smoking cigarettes in ~ 1990"  Substance Use Topics  . Alcohol use: Yes    Comment: at least two drinks daily  . Drug use: No     Allergies   Trazodone and nefazodone   Review of Systems Review of Systems  Unable to perform ROS: Mental status change     Physical Exam Updated Vital Signs BP 110/81   Pulse (!) 110   Temp 99 F (37.2 C) (Oral)   Resp 13   Ht 5\' 10"  (1.778 m)   Wt 85.7 kg   LMP  (LMP Unknown) Comment: AUB  SpO2 96%   BMI 27.12 kg/m   Physical Exam Constitutional:      General: Alyssa Garner is not in acute distress.    Appearance: Alyssa Garner is not ill-appearing.     Comments: Responds to questions and all stimuli. Falling asleep if no stimuli present  HENT:     Head: Normocephalic and atraumatic.     Mouth/Throat:     Mouth: Mucous membranes are moist.  Cardiovascular:     Rate and Rhythm: Regular rhythm. Tachycardia present.     Pulses: Normal pulses.     Heart sounds: No murmur. No friction rub.  Pulmonary:     Effort: Pulmonary effort is normal. No respiratory distress.     Breath sounds: Normal breath sounds.  Musculoskeletal:     Comments: Right ankle: Inspection: mild swelling noted around medial and lateral malleolus. Lateral displacement of lateral malleolus  Palpation: very tender to palpation lateral malleolus, lateral foot Range of motion: no range of motion Neurovascular: skin warm and dry, sensation intact, palpable pt/dp     ED Treatments / Results  Labs (all labs ordered are listed, but only abnormal results are displayed)  Labs Reviewed - No data to display  EKG None  Radiology Dg Ankle Complete Right  Result Date: 10/17/2018 CLINICAL DATA:  Larey SeatFell today and injured ankle. EXAM: RIGHT ANKLE - COMPLETE 3+ VIEW COMPARISON:  None. FINDINGS: The ankle mortise is maintained. Bimalleolar ankle fractures are noted with a transverse fracture through the base of the medial malleolus without significant displacement. There is also an oblique coursing distal fibular shaft fracture at and above the level of the ankle mortise. No significant displacement. The posterior malleolus is intact. The mid and hindfoot bony structures are intact. IMPRESSION: Relatively nondisplaced bimalleolar  ankle fractures. Electronically Signed   By: Marijo Sanes M.D.   On: 10/17/2018 09:36   Dg Knee Complete 4 Views Right  Result Date: 10/17/2018 CLINICAL DATA:  [place].  Right knee pain. EXAM: RIGHT KNEE - COMPLETE 4+ VIEW COMPARISON:  None. FINDINGS: The joint spaces are maintained. No acute fracture is identified. Small suprapatellar knee joint effusion. IMPRESSION: No acute bony findings. Small joint effusion. Electronically Signed   By: Marijo Sanes M.D.   On: 10/17/2018 09:38    Procedures Procedures (including critical care time)  Medications Ordered in ED Medications  morphine 4 MG/ML injection 4 mg (4 mg Intravenous Given 10/17/18 1026)     Initial Impression / Assessment and Plan / ED Course  I have reviewed the triage vital signs and the nursing notes.  Pertinent labs & imaging results that were available during my care of the patient were reviewed by me and considered in my medical decision making (see chart for details).  53 year old female who presents with right ankle pain with mild deformity noted on inspection.  Unclear exactly how Alyssa Garner injured her ankle, injury likely secondary to intoxication.  Ankle x-rays which noted bimalleolar ankle fractures, and distal fibular shaft fracture.  No displacement noted.  We will proceed with  posterior and stirrup splint.  Patient did wake up and was much more alert noted to be in a lot of pain, 4 mg morphine given prior to splint placement.  We will get patient set of crutches.  Alyssa Garner can follow-up with orthopedics in 3 to 4 days in office.  Patient to take Tylenol 650 every 6 hours, ibuprofen 200 mg every 6 hours alternation.  Can take her previously prescribed oxycodone 5 mg as needed for breakthrough pain.  Alyssa Garner established with [facility] sports medicine, gave number for her to schedule appointment with them.    Patient unable to tolerate crutches 2/2 broken wrist. Consulted social work to assist with getting a wheelchair or rolling walker. Once patient gets her DME Alyssa Garner will be stable for discharge.  Final Clinical Impressions(s) / ED Diagnoses   Final diagnoses:  Closed fracture of right ankle, initial encounter    ED Discharge Orders    None    Guadalupe Dawn MD PGY-3 Family Medicine Resident   Guadalupe Dawn, MD 10/17/18 1114    Guadalupe Dawn, MD 10/17/18 [place], [place], DO 10/17/18 1146

## 2018-10-17 NOTE — ED Notes (Signed)
SW called this RN and informed that the pt's wheelchair will be delivered to her house. Pt informed of this immediately. Pt states "oh whatever, this place is a joke and  This dr is a quack, just get me out of here" Pt's Phone number is [phone] and will be called when the wheelchair can be delivered later today. Pt wheeled to the exit and waiting for husband to picker up in wheelchair.

## 2018-10-17 NOTE — Discharge Instructions (Signed)
You have an ankle fracture.  We splinted this gave you crutches.  You will need to see an orthopedic surgeon for further evaluation and possible surgery.  Please call [phone] to schedule an appointment over at [facility].  You can take Tylenol 650 mg every 6 hours, alternating with ibuprofen 600 mg every 6 hours.  You can take your oxycodone for breakthrough pain.

## 2018-10-17 NOTE — TOC Progression Note (Signed)
Transition of Care [facility]) - Progression Note    Patient Details  Name: DEMARIS BOUSQUET MRN: [id number] Date of Birth: Jul 02, 1965  [facility] of [facility] CM/SW Contact  Fuller Mandril, RN Phone Number: 10/17/2018, 1:09 PM  Clinical Narrative:    [facility] met with pt at bedside regarding need for DME rolling walker.  Pt states she does not have any DME presently and could use a wheelchair while her ankle heals.   Expected Discharge Plan: Home/Self Care Barriers to Discharge: Equipment Delay  Expected Discharge Plan and Services Expected Discharge Plan: Home/Self Care   Discharge Planning Services: CM Consult Post Acute Care Choice: Durable Medical Equipment Living arrangements for the past 2 months: Single Family Home Expected Discharge Date: 10/17/18               DME Arranged: Wheelchair manual DME Agency: [employer] Date DME Agency Contacted: 10/17/18 Time DME Agency Contacted: 72 Representative spoke with at DME Agency: Bethanne Ginger             Social Determinants of Health ([place]) Interventions    Readmission Risk Interventions No flowsheet data found.

## 2018-10-17 NOTE — ED Notes (Signed)
Called ortho to place right ankle splint/crutches. Pt requesting pain meds. Notified MD

## 2018-10-17 NOTE — Progress Notes (Signed)
Orthopedic Tech Progress Note Patient Details:  Alyssa Garner 14-Mar-1966 [id number]  Ortho Devices Type of Ortho Device: Ace wrap, Stirrup splint, Post (short leg) splint, Crutches Ortho Device/Splint Location: right Ortho Device/Splint Interventions: Application   Post Interventions Patient Tolerated: Well, Ambulated well Instructions Provided: Care of device   [name] 10/17/2018, 10:11 AM

## 2018-10-18 DIAGNOSIS — M25571 Pain in right ankle and joints of right foot: Secondary | ICD-10-CM | POA: Diagnosis not present

## 2018-10-19 DIAGNOSIS — M25572 Pain in left ankle and joints of left foot: Secondary | ICD-10-CM | POA: Diagnosis not present

## 2018-10-25 DIAGNOSIS — S82844D Nondisplaced bimalleolar fracture of right lower leg, subsequent encounter for closed fracture with routine healing: Secondary | ICD-10-CM | POA: Diagnosis not present

## 2018-10-25 DIAGNOSIS — S52501D Unspecified fracture of the lower end of right radius, subsequent encounter for closed fracture with routine healing: Secondary | ICD-10-CM | POA: Diagnosis not present

## 2018-10-25 DIAGNOSIS — E039 Hypothyroidism, unspecified: Secondary | ICD-10-CM | POA: Diagnosis not present

## 2018-10-25 DIAGNOSIS — Z9181 History of falling: Secondary | ICD-10-CM | POA: Diagnosis not present

## 2018-10-25 DIAGNOSIS — Z4789 Encounter for other orthopedic aftercare: Secondary | ICD-10-CM | POA: Diagnosis not present

## 2018-10-25 DIAGNOSIS — F418 Other specified anxiety disorders: Secondary | ICD-10-CM | POA: Diagnosis not present

## 2018-10-27 DIAGNOSIS — S52501D Unspecified fracture of the lower end of right radius, subsequent encounter for closed fracture with routine healing: Secondary | ICD-10-CM | POA: Diagnosis not present

## 2018-10-27 DIAGNOSIS — F418 Other specified anxiety disorders: Secondary | ICD-10-CM | POA: Diagnosis not present

## 2018-10-27 DIAGNOSIS — E039 Hypothyroidism, unspecified: Secondary | ICD-10-CM | POA: Diagnosis not present

## 2018-10-27 DIAGNOSIS — S82844D Nondisplaced bimalleolar fracture of right lower leg, subsequent encounter for closed fracture with routine healing: Secondary | ICD-10-CM | POA: Diagnosis not present

## 2018-10-27 DIAGNOSIS — Z9181 History of falling: Secondary | ICD-10-CM | POA: Diagnosis not present

## 2018-10-28 ENCOUNTER — Other Ambulatory Visit: Payer: Self-pay | Admitting: Orthopaedic Surgery

## 2018-10-28 ENCOUNTER — Other Ambulatory Visit: Payer: Self-pay

## 2018-10-28 ENCOUNTER — Encounter (HOSPITAL_BASED_OUTPATIENT_CLINIC_OR_DEPARTMENT_OTHER): Payer: Self-pay | Admitting: *Deleted

## 2018-10-28 DIAGNOSIS — S82841A Displaced bimalleolar fracture of right lower leg, initial encounter for closed fracture: Secondary | ICD-10-CM | POA: Diagnosis not present

## 2018-10-31 ENCOUNTER — Other Ambulatory Visit (HOSPITAL_COMMUNITY): Admission: RE | Admit: 2018-10-31 | Payer: BC Managed Care – PPO | Source: Ambulatory Visit

## 2018-10-31 DIAGNOSIS — F322 Major depressive disorder, single episode, severe without psychotic features: Secondary | ICD-10-CM | POA: Diagnosis not present

## 2018-11-01 DIAGNOSIS — F418 Other specified anxiety disorders: Secondary | ICD-10-CM | POA: Diagnosis not present

## 2018-11-01 DIAGNOSIS — S82842A Displaced bimalleolar fracture of left lower leg, initial encounter for closed fracture: Secondary | ICD-10-CM | POA: Diagnosis not present

## 2018-11-01 DIAGNOSIS — S52501A Unspecified fracture of the lower end of right radius, initial encounter for closed fracture: Secondary | ICD-10-CM | POA: Diagnosis not present

## 2018-11-01 DIAGNOSIS — E039 Hypothyroidism, unspecified: Secondary | ICD-10-CM | POA: Diagnosis not present

## 2018-11-01 DIAGNOSIS — Z9181 History of falling: Secondary | ICD-10-CM | POA: Diagnosis not present

## 2018-11-01 DIAGNOSIS — S52501D Unspecified fracture of the lower end of right radius, subsequent encounter for closed fracture with routine healing: Secondary | ICD-10-CM | POA: Diagnosis not present

## 2018-11-01 DIAGNOSIS — S82844D Nondisplaced bimalleolar fracture of right lower leg, subsequent encounter for closed fracture with routine healing: Secondary | ICD-10-CM | POA: Diagnosis not present

## 2018-11-02 ENCOUNTER — Other Ambulatory Visit (HOSPITAL_COMMUNITY)
Admission: RE | Admit: 2018-11-02 | Discharge: 2018-11-02 | Disposition: A | Discharge status: Home / Self Care | Payer: BC Managed Care – PPO | Source: Ambulatory Visit | Attending: Orthopaedic Surgery | Admitting: Orthopaedic Surgery

## 2018-11-02 DIAGNOSIS — Z01812 Encounter for preprocedural laboratory examination: Secondary | ICD-10-CM | POA: Insufficient documentation

## 2018-11-02 DIAGNOSIS — Z20828 Contact with and (suspected) exposure to other viral communicable diseases: Secondary | ICD-10-CM | POA: Insufficient documentation

## 2018-11-02 LAB — SARS CORONAVIRUS 2 (TAT 6-24 HRS): SARS Coronavirus 2: NEGATIVE

## 2018-11-02 NOTE — Progress Notes (Signed)
Patient called because Covid test not done for surgery scheduled tomorrow.  Voicemail message left to go for Covid test by noon today and that surgery will be cancelled if not done.  Dr. Pollie Friar office also notified.

## 2018-11-03 ENCOUNTER — Ambulatory Visit (HOSPITAL_COMMUNITY): Payer: BC Managed Care – PPO

## 2018-11-03 ENCOUNTER — Encounter (HOSPITAL_BASED_OUTPATIENT_CLINIC_OR_DEPARTMENT_OTHER)
Admission: RE | Disposition: A | Discharge status: Home / Self Care | Payer: Self-pay | Source: Home / Self Care | Attending: Orthopaedic Surgery

## 2018-11-03 ENCOUNTER — Other Ambulatory Visit: Payer: Self-pay

## 2018-11-03 ENCOUNTER — Ambulatory Visit (HOSPITAL_BASED_OUTPATIENT_CLINIC_OR_DEPARTMENT_OTHER): Payer: BC Managed Care – PPO | Admitting: Certified Registered"

## 2018-11-03 ENCOUNTER — Ambulatory Visit (HOSPITAL_BASED_OUTPATIENT_CLINIC_OR_DEPARTMENT_OTHER)
Admission: RE | Admit: 2018-11-03 | Discharge: 2018-11-03 | Disposition: A | Discharge status: Home / Self Care | Payer: BC Managed Care – PPO | Attending: Orthopaedic Surgery | Admitting: Orthopaedic Surgery

## 2018-11-03 ENCOUNTER — Encounter (HOSPITAL_BASED_OUTPATIENT_CLINIC_OR_DEPARTMENT_OTHER): Payer: Self-pay

## 2018-11-03 DIAGNOSIS — S82491D Other fracture of shaft of right fibula, subsequent encounter for closed fracture with routine healing: Secondary | ICD-10-CM | POA: Diagnosis not present

## 2018-11-03 DIAGNOSIS — Z791 Long term (current) use of non-steroidal anti-inflammatories (NSAID): Secondary | ICD-10-CM | POA: Insufficient documentation

## 2018-11-03 DIAGNOSIS — Z79899 Other long term (current) drug therapy: Secondary | ICD-10-CM | POA: Insufficient documentation

## 2018-11-03 DIAGNOSIS — F329 Major depressive disorder, single episode, unspecified: Secondary | ICD-10-CM | POA: Insufficient documentation

## 2018-11-03 DIAGNOSIS — S82841A Displaced bimalleolar fracture of right lower leg, initial encounter for closed fracture: Secondary | ICD-10-CM | POA: Diagnosis not present

## 2018-11-03 DIAGNOSIS — Z79890 Hormone replacement therapy (postmenopausal): Secondary | ICD-10-CM | POA: Insufficient documentation

## 2018-11-03 DIAGNOSIS — W19XXXA Unspecified fall, initial encounter: Secondary | ICD-10-CM | POA: Diagnosis not present

## 2018-11-03 DIAGNOSIS — Z87891 Personal history of nicotine dependence: Secondary | ICD-10-CM | POA: Diagnosis not present

## 2018-11-03 DIAGNOSIS — K219 Gastro-esophageal reflux disease without esophagitis: Secondary | ICD-10-CM | POA: Insufficient documentation

## 2018-11-03 DIAGNOSIS — F419 Anxiety disorder, unspecified: Secondary | ICD-10-CM | POA: Diagnosis not present

## 2018-11-03 DIAGNOSIS — J309 Allergic rhinitis, unspecified: Secondary | ICD-10-CM | POA: Diagnosis not present

## 2018-11-03 DIAGNOSIS — M810 Age-related osteoporosis without current pathological fracture: Secondary | ICD-10-CM | POA: Insufficient documentation

## 2018-11-03 DIAGNOSIS — G8918 Other acute postprocedural pain: Secondary | ICD-10-CM | POA: Diagnosis not present

## 2018-11-03 DIAGNOSIS — E039 Hypothyroidism, unspecified: Secondary | ICD-10-CM | POA: Diagnosis not present

## 2018-11-03 DIAGNOSIS — S82291D Other fracture of shaft of right tibia, subsequent encounter for closed fracture with routine healing: Secondary | ICD-10-CM | POA: Diagnosis not present

## 2018-11-03 DIAGNOSIS — Z20828 Contact with and (suspected) exposure to other viral communicable diseases: Secondary | ICD-10-CM | POA: Insufficient documentation

## 2018-11-03 DIAGNOSIS — Z7982 Long term (current) use of aspirin: Secondary | ICD-10-CM | POA: Insufficient documentation

## 2018-11-03 DIAGNOSIS — S82891A Other fracture of right lower leg, initial encounter for closed fracture: Secondary | ICD-10-CM

## 2018-11-03 HISTORY — DX: Reactive attachment disorder of childhood: F94.1

## 2018-11-03 HISTORY — PX: ORIF ANKLE FRACTURE: SHX5408

## 2018-11-03 SURGERY — OPEN REDUCTION INTERNAL FIXATION (ORIF) ANKLE FRACTURE
Anesthesia: Regional | Site: Ankle | Laterality: Right

## 2018-11-03 MED ORDER — PROMETHAZINE HCL 25 MG/ML IJ SOLN: 6.2500 mg | INTRAMUSCULAR | Status: DC | PRN

## 2018-11-03 MED ORDER — ONDANSETRON HCL 4 MG PO TABS: 4.0000 mg | ORAL_TABLET | Freq: Three times a day (TID) | ORAL | 1 refills | Status: AC | PRN

## 2018-11-03 MED ORDER — CEFAZOLIN SODIUM-DEXTROSE 2-4 GM/100ML-% IV SOLN
2.0000 g | INTRAVENOUS | Status: AC
  Administered 2018-11-03: 2 g via INTRAVENOUS

## 2018-11-03 MED ORDER — EPHEDRINE 5 MG/ML INJ
INTRAVENOUS | Status: AC
  Filled 2018-11-03: qty 10

## 2018-11-03 MED ORDER — FENTANYL CITRATE (PF) 100 MCG/2ML IJ SOLN
INTRAMUSCULAR | Status: AC
  Filled 2018-11-03: qty 2

## 2018-11-03 MED ORDER — LACTATED RINGERS IV SOLN
INTRAVENOUS | Status: DC
  Administered 2018-11-03: 09:00:00 via INTRAVENOUS

## 2018-11-03 MED ORDER — ACETAMINOPHEN 10 MG/ML IV SOLN: 1000.0000 mg | Freq: Once | INTRAVENOUS | Status: DC | PRN

## 2018-11-03 MED ORDER — OXYCODONE HCL 5 MG PO TABS: 5.0000 mg | ORAL_TABLET | Freq: Once | ORAL | Status: DC | PRN

## 2018-11-03 MED ORDER — OXYCODONE HCL 5 MG/5ML PO SOLN: 5.0000 mg | Freq: Once | ORAL | Status: DC | PRN

## 2018-11-03 MED ORDER — MIDAZOLAM HCL 2 MG/2ML IJ SOLN
1.0000 mg | INTRAMUSCULAR | Status: DC | PRN
  Administered 2018-11-03: 2 mg via INTRAVENOUS

## 2018-11-03 MED ORDER — FENTANYL CITRATE (PF) 100 MCG/2ML IJ SOLN
50.0000 ug | INTRAMUSCULAR | Status: AC | PRN
  Administered 2018-11-03: 10:00:00 100 ug via INTRAVENOUS
  Administered 2018-11-03 (×3): 25 ug via INTRAVENOUS

## 2018-11-03 MED ORDER — HYDROMORPHONE HCL 1 MG/ML IJ SOLN: 0.2500 mg | INTRAMUSCULAR | Status: DC | PRN

## 2018-11-03 MED ORDER — 0.9 % SODIUM CHLORIDE (POUR BTL) OPTIME
TOPICAL | Status: DC | PRN
  Administered 2018-11-03: 120 mL

## 2018-11-03 MED ORDER — DEXAMETHASONE SODIUM PHOSPHATE 10 MG/ML IJ SOLN
INTRAMUSCULAR | Status: DC | PRN
  Administered 2018-11-03: 4 mg via INTRAVENOUS

## 2018-11-03 MED ORDER — MIDAZOLAM HCL 2 MG/2ML IJ SOLN
INTRAMUSCULAR | Status: AC
  Filled 2018-11-03: qty 2

## 2018-11-03 MED ORDER — KETOROLAC TROMETHAMINE 30 MG/ML IJ SOLN: 30.0000 mg | Freq: Once | INTRAMUSCULAR | Status: DC

## 2018-11-03 MED ORDER — KETOROLAC TROMETHAMINE 30 MG/ML IJ SOLN
INTRAMUSCULAR | Status: DC | PRN
  Administered 2018-11-03: 30 mg via INTRAVENOUS

## 2018-11-03 MED ORDER — ONDANSETRON HCL 4 MG/2ML IJ SOLN
INTRAMUSCULAR | Status: DC | PRN
  Administered 2018-11-03: 4 mg via INTRAVENOUS

## 2018-11-03 MED ORDER — ACETAMINOPHEN 500 MG PO TABS
ORAL_TABLET | ORAL | Status: AC
  Filled 2018-11-03: qty 2

## 2018-11-03 MED ORDER — GABAPENTIN 300 MG PO CAPS: 300.0000 mg | ORAL_CAPSULE | Freq: Every day | ORAL | 0 refills | Status: AC

## 2018-11-03 MED ORDER — ROPIVACAINE HCL 5 MG/ML IJ SOLN
INTRAMUSCULAR | Status: DC | PRN
  Administered 2018-11-03: 45 mL via EPIDURAL

## 2018-11-03 MED ORDER — ACETAMINOPHEN 10 MG/ML IV SOLN
1000.0000 mg | Freq: Once | INTRAVENOUS | Status: AC
  Administered 2018-11-03: 1000 mg via INTRAVENOUS

## 2018-11-03 MED ORDER — PROPOFOL 10 MG/ML IV BOLUS
INTRAVENOUS | Status: AC
  Filled 2018-11-03: qty 20

## 2018-11-03 MED ORDER — ACETAMINOPHEN 500 MG PO TABS: 1000.0000 mg | ORAL_TABLET | Freq: Four times a day (QID) | ORAL | 2 refills | Status: AC | PRN

## 2018-11-03 MED ORDER — OXYCODONE HCL 5 MG PO TABS: 5.0000 mg | ORAL_TABLET | ORAL | 0 refills | Status: AC | PRN

## 2018-11-03 MED ORDER — POVIDONE-IODINE 10 % EX SWAB: 2.0000 "application " | Freq: Once | CUTANEOUS | Status: DC

## 2018-11-03 MED ORDER — CLONIDINE HCL (ANALGESIA) 100 MCG/ML EP SOLN
EPIDURAL | Status: DC | PRN
  Administered 2018-11-03: 100 ug

## 2018-11-03 MED ORDER — PROPOFOL 10 MG/ML IV BOLUS
INTRAVENOUS | Status: DC | PRN
  Administered 2018-11-03: 200 mg via INTRAVENOUS

## 2018-11-03 MED ORDER — ACETAMINOPHEN 10 MG/ML IV SOLN
INTRAVENOUS | Status: AC
  Filled 2018-11-03: qty 100

## 2018-11-03 SURGICAL SUPPLY — 78 items
APL SKNCLS STERI-STRIP NONHPOA (GAUZE/BANDAGES/DRESSINGS)
BANDAGE ESMARK 6X9 LF (GAUZE/BANDAGES/DRESSINGS) ×1 IMPLANT
BENZOIN TINCTURE PRP APPL 2/3 (GAUZE/BANDAGES/DRESSINGS) IMPLANT
BIT DRILL 2 CANN GRADUATED (BIT) ×2 IMPLANT
BIT DRILL 2.5 CANN ENDOSCOPIC (BIT) ×2 IMPLANT
BIT DRILL 2.6 CANN (BIT) ×2 IMPLANT
BIT DRILL 3.5 CANN STRL (BIT) ×2 IMPLANT
BLADE SURG 15 STRL LF DISP TIS (BLADE) ×2 IMPLANT
BLADE SURG 15 STRL SS (BLADE) ×4
BNDG CMPR 9X4 STRL LF SNTH (GAUZE/BANDAGES/DRESSINGS)
BNDG COHESIVE 4X5 TAN STRL (GAUZE/BANDAGES/DRESSINGS) IMPLANT
BNDG ELASTIC 4X5.8 VLCR STR LF (GAUZE/BANDAGES/DRESSINGS) IMPLANT
BNDG ELASTIC 6X5.8 VLCR STR LF (GAUZE/BANDAGES/DRESSINGS) ×5 IMPLANT
BNDG ESMARK 4X9 LF (GAUZE/BANDAGES/DRESSINGS) IMPLANT
BNDG ESMARK 6X9 LF (GAUZE/BANDAGES/DRESSINGS) ×3
CHLORAPREP W/TINT 26 (MISCELLANEOUS) ×3 IMPLANT
CLOSURE WOUND 1/2 X4 (GAUZE/BANDAGES/DRESSINGS)
COVER BACK TABLE REUSABLE LG (DRAPES) ×3 IMPLANT
COVER WAND RF STERILE (DRAPES) IMPLANT
CUFF TOURN SGL QUICK 34 (TOURNIQUET CUFF) ×3
CUFF TRNQT CYL 34X4.125X (TOURNIQUET CUFF) ×1 IMPLANT
DECANTER SPIKE VIAL GLASS SM (MISCELLANEOUS) IMPLANT
DRAPE C-ARM 42X72 X-RAY (DRAPES) ×2 IMPLANT
DRAPE C-ARMOR (DRAPES) ×2 IMPLANT
DRAPE EXTREMITY T 121X128X90 (DISPOSABLE) ×3 IMPLANT
DRAPE IMP U-DRAPE 54X76 (DRAPES) ×3 IMPLANT
DRAPE OEC MINIVIEW 54X84 (DRAPES) IMPLANT
DRAPE U-SHAPE 47X51 STRL (DRAPES) ×3 IMPLANT
ELECT REM PT RETURN 9FT ADLT (ELECTROSURGICAL) ×3
ELECTRODE REM PT RTRN 9FT ADLT (ELECTROSURGICAL) ×1 IMPLANT
GAUZE SPONGE 4X4 12PLY STRL (GAUZE/BANDAGES/DRESSINGS) ×3 IMPLANT
GAUZE XEROFORM 1X8 LF (GAUZE/BANDAGES/DRESSINGS) ×3 IMPLANT
GLOVE BIO SURGEON STRL SZ 6.5 (GLOVE) ×3 IMPLANT
GLOVE BIO SURGEONS STRL SZ 6.5 (GLOVE) ×3
GLOVE BIOGEL M STRL SZ7.5 (GLOVE) ×3 IMPLANT
GLOVE BIOGEL PI IND STRL 7.0 (GLOVE) IMPLANT
GLOVE BIOGEL PI IND STRL 8 (GLOVE) ×1 IMPLANT
GLOVE BIOGEL PI INDICATOR 7.0 (GLOVE) ×8
GLOVE BIOGEL PI INDICATOR 8 (GLOVE) ×2
GOWN STRL REUS W/ TWL LRG LVL3 (GOWN DISPOSABLE) ×1 IMPLANT
GOWN STRL REUS W/ TWL XL LVL3 (GOWN DISPOSABLE) ×1 IMPLANT
GOWN STRL REUS W/TWL LRG LVL3 (GOWN DISPOSABLE) ×9
GOWN STRL REUS W/TWL XL LVL3 (GOWN DISPOSABLE) ×6
GUIDEWIRE 1.35MM (WIRE) ×4 IMPLANT
NS IRRIG 1000ML POUR BTL (IV SOLUTION) ×3 IMPLANT
PACK BASIN DAY SURGERY FS (CUSTOM PROCEDURE TRAY) ×3 IMPLANT
PAD CAST 4YDX4 CTTN HI CHSV (CAST SUPPLIES) ×1 IMPLANT
PADDING CAST COTTON 4X4 STRL (CAST SUPPLIES) ×3
PADDING CAST SYNTHETIC 4 (CAST SUPPLIES) ×8
PADDING CAST SYNTHETIC 4X4 STR (CAST SUPPLIES) ×2 IMPLANT
PENCIL BUTTON HOLSTER BLD 10FT (ELECTRODE) ×3 IMPLANT
PLATE DST LCK RT H4 (Plate) ×2 IMPLANT
SCREW LOCK T10 FT 18X2.7X (Screw) IMPLANT
SCREW LOCKING 2.7X16MM (Screw) ×4 IMPLANT
SCREW LOCKING 2.7X18MM (Screw) ×2 IMPLANT
SCREW LOW PROFILE 3.5X14 (Screw) ×4 IMPLANT
SCREW LOW PROFILE 3.5X16 (Screw) ×2 IMPLANT
SCREW LOW PROFILE 4.0X40 (Screw) ×4 IMPLANT
SCREW NON-LOCKING 3.5X22MM (Screw) ×2 IMPLANT
SLEEVE SCD COMPRESS KNEE MED (MISCELLANEOUS) ×3 IMPLANT
SPLINT FAST PLASTER 5X30 (CAST SUPPLIES) ×40
SPLINT PLASTER CAST FAST 5X30 (CAST SUPPLIES) ×20 IMPLANT
SPONGE LAP 18X18 RF (DISPOSABLE) ×2 IMPLANT
STAPLER VISISTAT 35W (STAPLE) ×2 IMPLANT
STOCKINETTE 6  STRL (DRAPES) ×2
STOCKINETTE 6 STRL (DRAPES) ×1 IMPLANT
STRIP CLOSURE SKIN 1/2X4 (GAUZE/BANDAGES/DRESSINGS) IMPLANT
SUCTION FRAZIER HANDLE 10FR (MISCELLANEOUS) ×4
SUCTION TUBE FRAZIER 10FR DISP (MISCELLANEOUS) ×1 IMPLANT
SUT ETHILON 3 0 PS 1 (SUTURE) ×3 IMPLANT
SUT MNCRL AB 3-0 PS2 18 (SUTURE) ×5 IMPLANT
SUT PDS AB 2-0 CT2 27 (SUTURE) ×3 IMPLANT
SUT VIC AB 3-0 FS2 27 (SUTURE) IMPLANT
SYR BULB 3OZ (MISCELLANEOUS) ×3 IMPLANT
TOWEL GREEN STERILE FF (TOWEL DISPOSABLE) ×6 IMPLANT
TUBE CONNECTING 20'X1/4 (TUBING) ×2
TUBE CONNECTING 20X1/4 (TUBING) ×3 IMPLANT
UNDERPAD 30X30 (UNDERPADS AND DIAPERS) ×3 IMPLANT

## 2018-11-03 NOTE — Anesthesia Procedure Notes (Signed)
Procedure Name: LMA Insertion Date/Time: 11/03/2018 10:28 AM Performed by: Lavonia Dana, CRNA Pre-anesthesia Checklist: Patient identified, Emergency Drugs available, Suction available and Patient being monitored Patient Re-evaluated:Patient Re-evaluated prior to induction Oxygen Delivery Method: Circle system utilized Preoxygenation: Pre-oxygenation with 100% oxygen Induction Type: IV induction Ventilation: Mask ventilation without difficulty LMA: LMA inserted LMA Size: 4.0 Number of attempts: 1 Airway Equipment and Method: Bite block Placement Confirmation: positive ETCO2 Tube secured with: Tape Dental Injury: Teeth and Oropharynx as per pre-operative assessment

## 2018-11-03 NOTE — H&P (Signed)
Alyssa Garner is an 53 y.o. female.   Chief Complaint: Right bimalleolar ankle fracture HPI: Patient presents today for operative treatment of her right ankle.  She had a fall and sustained a bimalleolar ankle fracture.  She is indicated for open treatment.  She had a fall over 1 month ago and sustained a right distal radius fracture and has been in a cast for this.  Patient complains of pain in the right ankle.  She is in a boot and has been maintaining nonweightbearing.  She denies any numbness or tingling in her foot.  She denies any recent fevers or chills.  Patient states she is anxious today.  Past Medical History:  Diagnosis Date  . Anxiety   . Arthritis    "neck" (06/25/2015)  . Attachment disorder   . Complication of anesthesia 1997   pt states with her first CS she had horrible pain and felt bad; "like it didn't kick in"  . Depression   . ETOH abuse   . GERD (gastroesophageal reflux disease)    occ OTC  . Heart murmur   . HYPOTHYROIDISM 04/09/2008  . Infection, kidney 1997   "hospitalized for 1 wk"  . Long-term current use of steroids   . LOW BACK PAIN 12/13/2006  . Osteoporosis    no treatment    Past Surgical History:  Procedure Laterality Date  . ABDOMINAL HYSTERECTOMY  10/2014   "left my ovaries"  . AUGMENTATION MAMMAPLASTY Bilateral ~ 1989; 2012  . BACK SURGERY    . CESAREAN SECTION  1998; 2000; 2007  . TUBAL LIGATION  2007    Family History  Adopted: Yes  Family history unknown: Yes   Social History:  reports that she has quit smoking. Her smoking use included cigarettes. She has a 2.50 pack-year smoking history. She has never used smokeless tobacco. She reports current alcohol use. She reports that she does not use drugs.  Allergies: No Known Allergies  Medications Prior to Admission  Medication Sig Dispense Refill  . ALPRAZolam (XANAX) 0.5 MG tablet TAKE ONE TABLET BY MOUTH THREE TIMES A DAY AS NEEDED FOR ANXIETY 90 tablet 4  .  amphetamine-dextroamphetamine (ADDERALL) 30 MG tablet Take 30 mg by mouth daily.    Marland Kitchen. aspirin 325 MG tablet Take 325 mg by mouth daily.    Marland Kitchen. ibuprofen (ADVIL,MOTRIN) 600 MG tablet Take 1 tablet (600 mg total) by mouth every 8 (eight) hours as needed. 90 tablet 11  . levothyroxine (SYNTHROID, LEVOTHROID) 150 MCG tablet Take 1 tablet (150 mcg total) by mouth daily before breakfast. 90 tablet 3  . venlafaxine XR (EFFEXOR-XR) 150 MG 24 hr capsule Take 150 mg by mouth daily with breakfast.    . zolpidem (AMBIEN) 10 MG tablet TAKE ONE TABLET BY MOUTH EVERY NIGHT AT BEDTIME AS NEEDED FOR SLEEP 30 tablet 0  . Probiotic Product (PROBIOTIC DAILY PO) Take 1 capsule by mouth daily. Reported on 07/10/2015      Results for orders placed or performed during the hospital encounter of 11/02/18 (from the past 48 hour(s))  SARS CORONAVIRUS 2 Nasal Swab Aptima Multi Swab     Status: None   Collection Time: 11/02/18 12:25 PM   Specimen: Aptima Multi Swab; Nasal Swab  Result Value Ref Range   SARS Coronavirus 2 NEGATIVE NEGATIVE    Comment: (NOTE) SARS-CoV-2 target nucleic acids are NOT DETECTED. The SARS-CoV-2 RNA is generally detectable in upper and lower respiratory specimens during the acute phase of infection. Negative results do  not preclude SARS-CoV-2 infection, do not rule out co-infections with other pathogens, and should not be used as the sole basis for treatment or other patient management decisions. Negative results must be combined with clinical observations, patient history, and epidemiological information. The expected result is Negative. Fact Sheet for Patients: SugarRoll.be Fact Sheet for Healthcare Providers: https://www.woods-mathews.com/ This test is not yet approved or cleared by the [place] FDA and  has been authorized for detection and/or diagnosis of SARS-CoV-2 by FDA under an Emergency Use Authorization (EUA). This EUA will remain  in  effect (meaning this test can be used) for the duration of the COVID-19 declaration under Section 56 4(b)(1) of the Act, 21 U.S.C. section 360bbb-3(b)(1), unless the authorization is terminated or revoked sooner. Performed at [facility], [place] [address] [place], [place] [address]    No results found.  Review of Systems  Constitutional: Negative.   HENT: Negative.   Eyes: Negative.   Respiratory: Negative.   Cardiovascular: Negative.   Gastrointestinal: Negative.   Musculoskeletal:       Right ankle pain  Skin: Negative.   Neurological: Negative.   Psychiatric/Behavioral: The patient is nervous/anxious.     Blood pressure 106/78, pulse 91, temperature 98.4 F (36.9 C), temperature source Oral, resp. rate 18, height 5\' 10"  (1.778 m), weight 81 kg, SpO2 100 %. Physical Exam  Constitutional: She appears well-developed.  HENT:  Head: Normocephalic.  Eyes: Conjunctivae are normal.  Neck: Neck supple.  Cardiovascular: Normal rate.  Respiratory: Effort normal.  GI: Soft.  Musculoskeletal:     Comments: Right ankle demonstrates Ace wrap in place.  Boot has been removed.  There is swelling about the foot and ankle.  She has tenderness palpation medially and laterally about the ankle.  She is able to wiggle toes.  Endorses sensation light touch in the dorsal plantar foot.  Palpable dorsalis pedis pulse.  No tenderness palpation about the knee.  Patient has a cast on the right arm from prior distal radius fracture.  She is able to wiggle fingers.  No tenderness palpation about the elbow or shoulder.  No evidence of left upper or lower extremity injury.  Neurological: She is alert.  Skin: Skin is warm.  Psychiatric:  Patient is nervous and tearful     Assessment/Plan We will proceed with planned open reduction internal fixation of her right bimalleolar ankle fracture.  We will stress the syndesmosis after for possible fixation.  She understands the risk, benefits and  alternatives of surgery which were discussed in the office and this morning which include but are not limited to wound healing complications, infection, nonunion, malunion, need for further surgery, demonstrating structures, development of arthritis and continued pain.  She understands no guarantees to outcome.  She agrees to proceed with surgery.  She also understands the postoperative and perioperative risk which include death.  She also understands the perioperative weightbearing restrictions and agrees to comply.  Erle Crocker, MD 11/03/2018, 9:35 AM

## 2018-11-03 NOTE — Op Note (Signed)
Lucy Antigualizabeth F Wester female 53 y.o. 11/03/2018  PreOperative Diagnosis: Right bimalleolar ankle fracture  PostOperative Diagnosis: Right bimalleolar ankle fracture  PROCEDURE: Open treatment of bimalleolar ankle fracture  SURGEON: Dub Mikeshristopher Risa Auman, MD  ASSISTANT: Marge, RNFA student  ANESTHESIA: General with peripheral nerve block  FINDINGS: Right bimalleolar ankle fracture, displaced  IMPLANTS: Arthrex distal fibular locking plate and 4 oh cannulated screws  INDICATIONS:53 y.o. female fell and sustained an ankle fracture.  She has a history of depression and anxiety but no history of diabetes or smoking.  Given the displacement and instability of her fracture she is indicated for open treatment.  We discussed the risk benefits alternatives of surgery which include but not limited to wound healing complications, infection, nonunion, malunion, need for further surgery, demonstrating structures and continued pain or development of arthritis.  She also understood the perioperative and anesthetic risk which included death.  We also discussed the perioperative weightbearing restrictions and she agreed to comply.  She was amenable to surgery and wished to proceed.  PROCEDURE:Patient was identified in the preoperative holding area.  The right leg was marked myself.  The consent was signed myself and the patient.  Peripheral nerve blockade was performed by anesthesia.  She was taken the operative suite placed supine operative table.  Thigh tourniquet was placed on the right thigh after LMA anesthesia was induced without difficulty.  Preoperative antibiotics were given.  A bump was placed on the right hip and all bony prominences were well-padded.  The right lower extremity was prepped and draped in the usual sterile fashion.  Surgical timeout was performed we began by elevating the limb and inflating the tourniquet to 250 mmHg.    We began by making a longitudinal incision overlying the distal  fibula.  This was taken sharply down through skin and subcutaneous tissue.  Blunt dissection was used to identify any branch of the superficial peroneal nerve and there were none within the surgical field.  The incision was then taken sharply down to bone and the fracture site was identified.  Fracture site was mobilized.  There was notable amount of fracture callus given the duration since her fracture occurred.  The fracture callus was debrided and the fracture site was mobilized.  Then under direct visualization the fracture sites were cleaned with a rondure and curette of any fracture hematoma and callus formation.  Then the fracture of the fibula was reduced under direct visualization and held provisionally with a lobster claw.  Then fluoroscopy confirmed adequate reduction of the ankle mortise at that time.  Then a combination of locking and nonlocking screws were used after placement of a lag screw across the fracture by technique.  This provided good stability of the distal fibula fracture.  We then turned our attention to the medial malleolus.  There was continued displacement of the medial malleolus and therefore an incision was made overlying this.  This was taken sharply down through skin and subcutaneous tissue.  Bovie cautery was used for skin bleeders.  Then sharp dissection down to the fracture and the fracture site was identified.  The soft tissue flap was carried anteriorly to identify the medial gutter of the ankle joint.  Then the fracture site was mobilized and using a curette and rondure the fracture was cleared out of hematoma and fracture callus.  Then the fracture was reduced and held provisionally with a pointed reduction forcep.  This was done under direct visualization.  Then fluoroscopy confirmed adequate reduction.  2 4.0 mm  partially-threaded cannulated screws were placed across the fracture site with good fixation and bite.  Then under fluoroscopy the ankle was stressed and found to  be stable with regard to medial clear space widening or syndesmotic widening.  The wounds were irrigated and the deep tissue was closed with a 2-0 PDS.  The subcuticular tissue was closed with 3-0 Monocryl and the skin with staples.  Xeroform placed on the wounds as well as 4 x 4's and sterile she cotton.  She was placed in a nonweightbearing short leg splint.  She tolerated this well.  There were no complications.  She was awakened from anesthesia and taken recovery in stable condition.  POST OPERATIVE INSTRUCTIONS: Nonweightbearing to right lower extremity Keep splint dry and limb elevated Continue 325 mg aspirin for DVT prophylaxis Call the office with concerns She will follow-up in 2 weeks for splint removal, x-rays of the right ankle nonweightbearing and suture removal if appropriate.  TOURNIQUET TIME: 56 minutes  BLOOD LOSS:  Minimal         DRAINS: none         SPECIMEN: none       COMPLICATIONS:  * No complications entered in OR log *         Disposition: PACU - hemodynamically stable.         Condition: stable

## 2018-11-03 NOTE — Anesthesia Procedure Notes (Signed)
Anesthesia Regional Block: Popliteal block   Pre-Anesthetic Checklist: ,, timeout performed, Correct Patient, Correct Site, Correct Laterality, Correct Procedure,, site marked, risks and benefits discussed, Surgical consent,  Pre-op evaluation,  At surgeon's request and post-op pain management  Laterality: Right  Prep: chloraprep       Needles:  Injection technique: Single-shot  Needle Type: Echogenic Stimulator Needle     Needle Length: 9cm  Needle Gauge: 21     Additional Needles:   Procedures:, nerve stimulator,,, ultrasound used (permanent image in chart), intact distal pulses,,,  Narrative:  Start time: 11/03/2018 9:50 AM End time: 11/03/2018 10:00 AM Injection made incrementally with aspirations every 5 mL.  Performed by: Personally  Anesthesiologist: Murvin Natal, MD  Additional Notes: Functioning IV was confirmed and monitors were applied.  A 90mm 21ga Arrow echogenic stimulator needle was used. Sterile prep, hand hygiene and sterile gloves were used.  Negative aspiration and negative test dose prior to incremental administration of local anesthetic. The patient tolerated the procedure well.

## 2018-11-03 NOTE — Anesthesia Postprocedure Evaluation (Signed)
Anesthesia Post Note  Patient: Alyssa Garner  Procedure(s) Performed: OPEN TREATMENT BIMALLEOLAR ANKLE FRACTURE (Right Ankle)     Patient location during evaluation: PACU Anesthesia Type: Regional and General Level of consciousness: awake and alert Pain management: pain level controlled Vital Signs Assessment: post-procedure vital signs reviewed and stable Respiratory status: spontaneous breathing, nonlabored ventilation, respiratory function stable and patient connected to nasal cannula oxygen Cardiovascular status: blood pressure returned to baseline and stable Postop Assessment: no apparent nausea or vomiting Anesthetic complications: no    Last Vitals:  Vitals:   11/03/18 1245 11/03/18 1300  BP: 120/88   Pulse: 82   Resp: 16   Temp: 36.7 C   SpO2:  98%    Last Pain:  Vitals:   11/03/18 1300  TempSrc:   PainSc: 0-No pain                 Ryan P Ellender

## 2018-11-03 NOTE — Transfer of Care (Signed)
Immediate Anesthesia Transfer of Care Note  Patient: Alyssa Garner  Procedure(s) Performed: OPEN TREATMENT BIMALLEOLAR ANKLE FRACTURE (Right Ankle)  Patient Location: PACU  Anesthesia Type:General and Regional   Level of Consciousness: drowsy  Airway & Oxygen Therapy: Patient Spontanous Breathing and Patient connected to nasal cannula oxygen  Post-op Assessment: Report given to RN and Post -op Vital signs reviewed and stable  Post vital signs: Reviewed and stable  Last Vitals:  Vitals Value Taken Time  BP 118/89 11/03/18 1206  Temp    Pulse 87 11/03/18 1211  Resp 15 11/03/18 1211  SpO2 100 % 11/03/18 1211  Vitals shown include unvalidated device data.  Last Pain:  Vitals:   11/03/18 0914  TempSrc: Oral  PainSc: 7       Patients Stated Pain Goal: 3 (91/91/66 0600)  Complications: No anesthesia complications noted

## 2018-11-03 NOTE — Discharge Instructions (Signed)
DR. [facility] POST-OP INSTRUCTIONS ° ° °Pain Management °1. The numbing medicine and your leg will last around 8 hours, take a dose of your pain medicine as soon as you feel it wearing off to avoid rebound pain. °2. Keep your foot elevated above heart level.  Make sure that your heel hangs free ('floats'). °3. Take all prescribed medication as directed. °4. If taking narcotic pain medication you may want to use an over-the-counter stool softener to avoid constipation. °5. You may take over-the-counter NSAIDs (ibuprofen, naproxen, etc.) as well as over-the-counter acetaminophen as directed on the packaging as a supplement for your pain and may also use it to wean away from the prescription medication. ° °Activity °?  °? Non-weightbearing °? Postoperatively, you will be placed into a splint which stays on for 2 weeks and then will be changed at your first postop visit. ° °First Postoperative Visit °1. Your first postop visit will be at least 2 weeks after surgery.  This should be scheduled when you schedule surgery. °2. If you do not have a postoperative visit scheduled please call [phone] to schedule an appointment. °3. At the appointment your incision will be evaluated for suture removal, x-rays will be obtained if necessary. ° °General Instructions °1. Swelling is very common after foot and ankle surgery.  It often takes 3 months for the foot and ankle to begin to feel comfortable.  Some amount of swelling will persist for 6-12 months. °2. DO NOT change the dressing.  If there is a problem with the dressing (too tight, loose, gets wet, etc.) please contact Dr. Adair's office. °3. DO NOT get the dressing wet.  For showers you can use an over-the-counter cast cover or wrap a washcloth around the top of your dressing and then cover it with a plastic bag and tape it to your leg. °4. DO NOT soak the incision (no tubs, pools, bath, etc.) until you have approval from Dr. Adair. ° °Contact Dr. Adairs  office or go to Emergency Room if: °1. Temperature above 101° F. °2. Increasing pain that is unresponsive to pain medication or elevation °3. Excessive redness or swelling in your foot °4. Dressing problems - excessive bloody drainage, looseness or tightness, or if dressing gets wet °5. Develop pain, swelling, warmth, or discoloration of your calf ° ° ° ° ° °Post Anesthesia Home Care Instructions ° °Activity: °Get plenty of rest for the remainder of the day. A responsible individual must stay with you for 24 hours following the procedure.  °For the next 24 hours, DO NOT: °-Drive a car °-Operate machinery °-Drink alcoholic beverages °-Take any medication unless instructed by your physician °-Make any legal decisions or sign important papers. ° °Meals: °Start with liquid foods such as gelatin or soup. Progress to regular foods as tolerated. Avoid greasy, spicy, heavy foods. If nausea and/or vomiting occur, drink only clear liquids until the nausea and/or vomiting subsides. Call your physician if vomiting continues. ° °Special Instructions/Symptoms: °Your throat may feel dry or sore from the anesthesia or the breathing tube placed in your throat during surgery. If this causes discomfort, gargle with warm salt water. The discomfort should disappear within 24 hours. ° °If you had a scopolamine patch placed behind your ear for the management of post- operative nausea and/or vomiting: ° °1. The medication in the patch is effective for 72 hours, after which it should be removed.  Wrap patch in a tissue and discard in the trash. Wash hands thoroughly   with soap and water. °2. You may remove the patch earlier than 72 hours if you experience unpleasant side effects which may include dry mouth, dizziness or visual disturbances. °3. Avoid touching the patch. Wash your hands with soap and water after contact with the patch. °   ° ° °Regional Anesthesia Blocks ° °1. Numbness or the inability to move the "blocked" extremity may last  from 3-48 hours after placement. The length of time depends on the medication injected and your individual response to the medication. If the numbness is not going away after 48 hours, call your surgeon. ° °2. The extremity that is blocked will need to be protected until the numbness is gone and the  Strength has returned. Because you cannot feel it, you will need to take extra care to avoid injury. Because it may be weak, you may have difficulty moving it or using it. You may not know what position it is in without looking at it while the block is in effect. ° °3. For blocks in the legs and feet, returning to weight bearing and walking needs to be done carefully. You will need to wait until the numbness is entirely gone and the strength has returned. You should be able to move your leg and foot normally before you try and bear weight or walk. You will need someone to be with you when you first try to ensure you do not fall and possibly risk injury. ° °4. Bruising and tenderness at the needle site are common side effects and will resolve in a few days. ° °5. Persistent numbness or new problems with movement should be communicated to the surgeon or the [facility] ([phone])/ [facility] ([phone]). ° °

## 2018-11-03 NOTE — Anesthesia Procedure Notes (Signed)
Anesthesia Regional Block: Adductor canal block   Pre-Anesthetic Checklist: ,, timeout performed, Correct Patient, Correct Site, Correct Laterality, Correct Procedure,, site marked, risks and benefits discussed, Surgical consent,  Pre-op evaluation,  At surgeon's request and post-op pain management  Laterality: Right  Prep: chloraprep       Needles:  Injection technique: Single-shot  Needle Type: Echogenic Stimulator Needle     Needle Length: 9cm  Needle Gauge: 21     Additional Needles:   Procedures:,,,, ultrasound used (permanent image in chart),,,,  Narrative:  Start time: 11/03/2018 10:00 AM End time: 11/03/2018 10:10 AM Injection made incrementally with aspirations every 5 mL.  Performed by: Personally  Anesthesiologist: Murvin Natal, MD  Additional Notes: Functioning IV was confirmed and monitors were applied. A time-out was performed. Hand hygiene and sterile gloves were used. The thigh was placed in a frog-leg position and prepped in a sterile fashion. A 82mm 21ga Arrow echogenic stimulator needle was placed using ultrasound guidance.  Negative aspiration and negative test dose prior to incremental administration of local anesthetic. The patient tolerated the procedure well.

## 2018-11-03 NOTE — Anesthesia Preprocedure Evaluation (Addendum)
Anesthesia Evaluation  Patient identified by MRN, date of birth, ID band Patient awake    Reviewed: Allergy & Precautions, NPO status , Patient's Chart, lab work & pertinent test results  Airway Mallampati: II  TM Distance: >3 FB Neck ROM: Full    Dental no notable dental hx.    Pulmonary former smoker,    Pulmonary exam normal breath sounds clear to auscultation       Cardiovascular negative cardio ROS Normal cardiovascular exam Rhythm:Regular Rate:Normal     Neuro/Psych PSYCHIATRIC DISORDERS Anxiety Depression negative neurological ROS     GI/Hepatic negative GI ROS, Neg liver ROS,   Endo/Other  Hypothyroidism   Renal/GU negative Renal ROS     Musculoskeletal LOW BACK PAIN   Abdominal   Peds  Hematology negative hematology ROS (+)   Anesthesia Other Findings BIMALLEOLAR ANKLE FRACTURE   Reproductive/Obstetrics                            Anesthesia Physical Anesthesia Plan  ASA: II  Anesthesia Plan: General and Regional   Post-op Pain Management: GA combined w/ Regional for post-op pain   Induction: Intravenous  PONV Risk Score and Plan: 3 and Ondansetron, Dexamethasone, Midazolam and Treatment may vary due to age or medical condition  Airway Management Planned: LMA  Additional Equipment:   Intra-op Plan:   Post-operative Plan: Extubation in OR  Informed Consent: I have reviewed the patients History and Physical, chart, labs and discussed the procedure including the risks, benefits and alternatives for the proposed anesthesia with the patient or authorized representative who has indicated his/her understanding and acceptance.     Dental advisory given  Plan Discussed with: CRNA  Anesthesia Plan Comments:         Anesthesia Quick Evaluation

## 2018-11-03 NOTE — Progress Notes (Signed)
Assisted Dr. Ellender with right, ultrasound guided, popliteal, adductor canal block. Side rails up, monitors on throughout procedure. See vital signs in flow sheet. Tolerated Procedure well. 

## 2018-11-04 ENCOUNTER — Encounter (HOSPITAL_BASED_OUTPATIENT_CLINIC_OR_DEPARTMENT_OTHER): Payer: Self-pay | Admitting: Orthopaedic Surgery

## 2018-11-04 NOTE — Addendum Note (Signed)
Addendum  created 11/04/18 1019 by Samanthamarie Ezzell, Ernesta Amble, CRNA   Charge Capture section accepted

## 2018-11-08 DIAGNOSIS — S52551D Other extraarticular fracture of lower end of right radius, subsequent encounter for closed fracture with routine healing: Secondary | ICD-10-CM | POA: Diagnosis not present

## 2018-11-15 DIAGNOSIS — M25552 Pain in left hip: Secondary | ICD-10-CM | POA: Diagnosis not present

## 2018-11-18 DIAGNOSIS — S82841A Displaced bimalleolar fracture of right lower leg, initial encounter for closed fracture: Secondary | ICD-10-CM | POA: Diagnosis not present

## 2018-11-18 DIAGNOSIS — M25571 Pain in right ankle and joints of right foot: Secondary | ICD-10-CM | POA: Diagnosis not present

## 2018-11-23 ENCOUNTER — Other Ambulatory Visit: Payer: Self-pay | Admitting: Endocrinology

## 2018-11-28 DIAGNOSIS — S82841A Displaced bimalleolar fracture of right lower leg, initial encounter for closed fracture: Secondary | ICD-10-CM | POA: Diagnosis not present

## 2018-12-06 DIAGNOSIS — S52551D Other extraarticular fracture of lower end of right radius, subsequent encounter for closed fracture with routine healing: Secondary | ICD-10-CM | POA: Diagnosis not present

## 2018-12-16 DIAGNOSIS — S82841A Displaced bimalleolar fracture of right lower leg, initial encounter for closed fracture: Secondary | ICD-10-CM | POA: Diagnosis not present

## 2018-12-18 DIAGNOSIS — M25571 Pain in right ankle and joints of right foot: Secondary | ICD-10-CM | POA: Diagnosis not present

## 2018-12-30 ENCOUNTER — Telehealth: Payer: Self-pay

## 2018-12-30 NOTE — Telephone Encounter (Signed)
Requesting Facility: [facility]  Document: Signed medical records request Other records requested: indicated on request  Above signed medical records request faxed successfully to our medical records dept. Documents and fax confirmation have been placed in the faxed file for future reference.

## 2019-01-03 DIAGNOSIS — T148XXA Other injury of unspecified body region, initial encounter: Secondary | ICD-10-CM | POA: Diagnosis not present

## 2019-01-03 DIAGNOSIS — E039 Hypothyroidism, unspecified: Secondary | ICD-10-CM | POA: Diagnosis not present

## 2019-01-03 DIAGNOSIS — M545 Low back pain: Secondary | ICD-10-CM | POA: Diagnosis not present

## 2019-01-03 DIAGNOSIS — Z13220 Encounter for screening for lipoid disorders: Secondary | ICD-10-CM | POA: Diagnosis not present

## 2019-01-03 DIAGNOSIS — Z23 Encounter for immunization: Secondary | ICD-10-CM | POA: Diagnosis not present

## 2019-01-07 ENCOUNTER — Other Ambulatory Visit: Payer: Self-pay | Admitting: Endocrinology

## 2019-01-07 NOTE — Telephone Encounter (Signed)
Please refill x 1 Further refills would have to be considered by new PCP  

## 2019-01-10 DIAGNOSIS — F322 Major depressive disorder, single episode, severe without psychotic features: Secondary | ICD-10-CM | POA: Diagnosis not present

## 2019-01-16 ENCOUNTER — Other Ambulatory Visit: Payer: Self-pay | Admitting: Obstetrics and Gynecology

## 2019-01-16 DIAGNOSIS — N6320 Unspecified lump in the left breast, unspecified quadrant: Secondary | ICD-10-CM

## 2019-01-18 DIAGNOSIS — M25571 Pain in right ankle and joints of right foot: Secondary | ICD-10-CM | POA: Diagnosis not present

## 2019-01-23 DIAGNOSIS — S82841D Displaced bimalleolar fracture of right lower leg, subsequent encounter for closed fracture with routine healing: Secondary | ICD-10-CM | POA: Diagnosis not present

## 2019-02-01 DIAGNOSIS — E039 Hypothyroidism, unspecified: Secondary | ICD-10-CM | POA: Diagnosis not present

## 2019-02-01 DIAGNOSIS — Z789 Other specified health status: Secondary | ICD-10-CM | POA: Diagnosis not present

## 2019-02-01 DIAGNOSIS — L659 Nonscarring hair loss, unspecified: Secondary | ICD-10-CM | POA: Diagnosis not present

## 2019-02-01 DIAGNOSIS — F411 Generalized anxiety disorder: Secondary | ICD-10-CM | POA: Diagnosis not present

## 2019-02-17 DIAGNOSIS — M25571 Pain in right ankle and joints of right foot: Secondary | ICD-10-CM | POA: Diagnosis not present

## 2019-03-01 DIAGNOSIS — S82841D Displaced bimalleolar fracture of right lower leg, subsequent encounter for closed fracture with routine healing: Secondary | ICD-10-CM | POA: Diagnosis not present

## 2019-03-01 DIAGNOSIS — F322 Major depressive disorder, single episode, severe without psychotic features: Secondary | ICD-10-CM | POA: Diagnosis not present

## 2019-03-13 DIAGNOSIS — S82841D Displaced bimalleolar fracture of right lower leg, subsequent encounter for closed fracture with routine healing: Secondary | ICD-10-CM | POA: Diagnosis not present

## 2019-03-13 DIAGNOSIS — R262 Difficulty in walking, not elsewhere classified: Secondary | ICD-10-CM | POA: Diagnosis not present

## 2019-03-13 DIAGNOSIS — M25571 Pain in right ankle and joints of right foot: Secondary | ICD-10-CM | POA: Diagnosis not present

## 2019-03-20 DIAGNOSIS — M25571 Pain in right ankle and joints of right foot: Secondary | ICD-10-CM | POA: Diagnosis not present

## 2019-04-06 DIAGNOSIS — F322 Major depressive disorder, single episode, severe without psychotic features: Secondary | ICD-10-CM | POA: Diagnosis not present

## 2019-04-20 DIAGNOSIS — M25571 Pain in right ankle and joints of right foot: Secondary | ICD-10-CM | POA: Diagnosis not present

## 2019-05-18 DIAGNOSIS — M25571 Pain in right ankle and joints of right foot: Secondary | ICD-10-CM | POA: Diagnosis not present

## 2019-05-18 DIAGNOSIS — J069 Acute upper respiratory infection, unspecified: Secondary | ICD-10-CM | POA: Diagnosis not present

## 2019-05-19 DIAGNOSIS — J069 Acute upper respiratory infection, unspecified: Secondary | ICD-10-CM | POA: Diagnosis not present

## 2019-05-19 DIAGNOSIS — U071 COVID-19: Secondary | ICD-10-CM | POA: Diagnosis not present

## 2019-06-18 DIAGNOSIS — M25571 Pain in right ankle and joints of right foot: Secondary | ICD-10-CM | POA: Diagnosis not present

## 2019-06-29 DIAGNOSIS — F322 Major depressive disorder, single episode, severe without psychotic features: Secondary | ICD-10-CM | POA: Diagnosis not present

## 2019-07-18 DIAGNOSIS — M25571 Pain in right ankle and joints of right foot: Secondary | ICD-10-CM | POA: Diagnosis not present

## 2019-07-18 DIAGNOSIS — E039 Hypothyroidism, unspecified: Secondary | ICD-10-CM | POA: Diagnosis not present

## 2019-07-18 DIAGNOSIS — E7800 Pure hypercholesterolemia, unspecified: Secondary | ICD-10-CM | POA: Diagnosis not present

## 2019-09-21 DIAGNOSIS — F322 Major depressive disorder, single episode, severe without psychotic features: Secondary | ICD-10-CM | POA: Diagnosis not present

## 2019-10-06 DIAGNOSIS — L820 Inflamed seborrheic keratosis: Secondary | ICD-10-CM | POA: Diagnosis not present

## 2019-10-06 DIAGNOSIS — L218 Other seborrheic dermatitis: Secondary | ICD-10-CM | POA: Diagnosis not present

## 2019-10-06 DIAGNOSIS — L2084 Intrinsic (allergic) eczema: Secondary | ICD-10-CM | POA: Diagnosis not present

## 2019-12-12 DIAGNOSIS — F322 Major depressive disorder, single episode, severe without psychotic features: Secondary | ICD-10-CM | POA: Diagnosis not present

## 2020-01-04 DIAGNOSIS — L218 Other seborrheic dermatitis: Secondary | ICD-10-CM | POA: Diagnosis not present

## 2020-01-09 DIAGNOSIS — E7800 Pure hypercholesterolemia, unspecified: Secondary | ICD-10-CM | POA: Diagnosis not present

## 2020-01-09 DIAGNOSIS — Z23 Encounter for immunization: Secondary | ICD-10-CM | POA: Diagnosis not present

## 2020-01-09 DIAGNOSIS — Z0001 Encounter for general adult medical examination with abnormal findings: Secondary | ICD-10-CM | POA: Diagnosis not present

## 2020-10-26 IMAGING — DX RIGHT ANKLE - COMPLETE 3+ VIEW
3 series · 3 of 3 positions shown · non-contrast
Comparison: None.

CLINICAL DATA: Fell today and injured ankle.

EXAM:
RIGHT ANKLE - COMPLETE 3+ VIEW

[x ankle ap right]
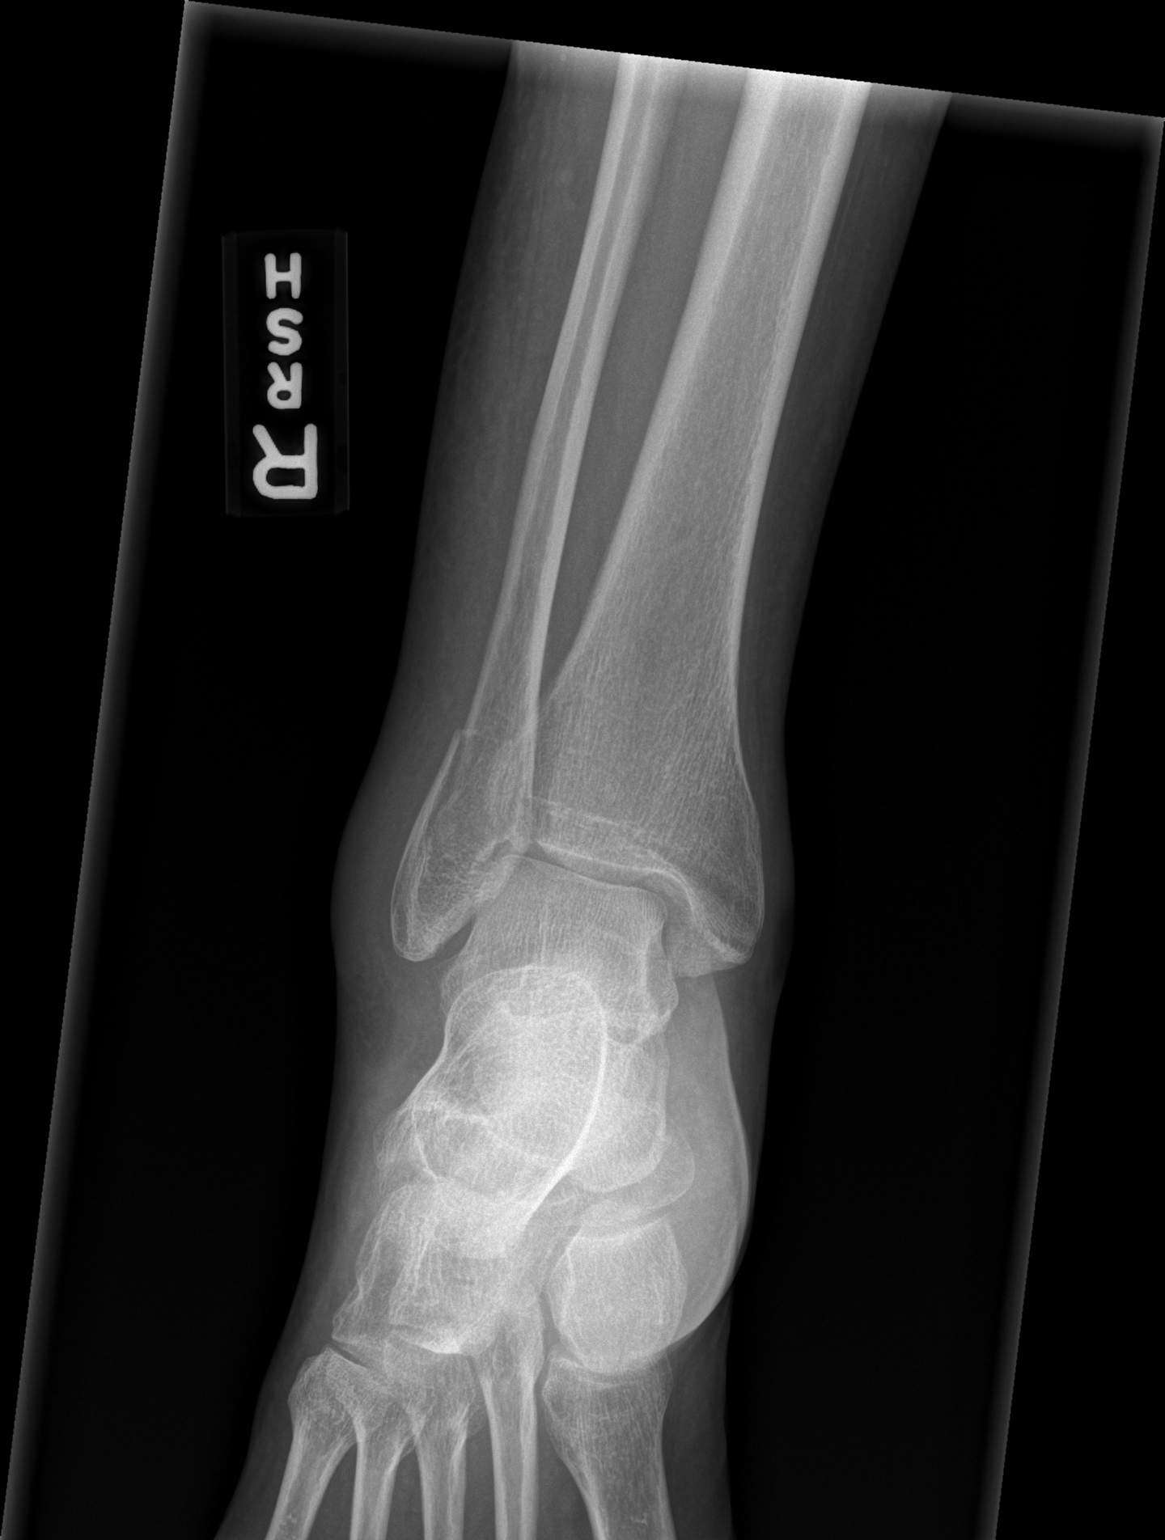

[x ankle obl right]
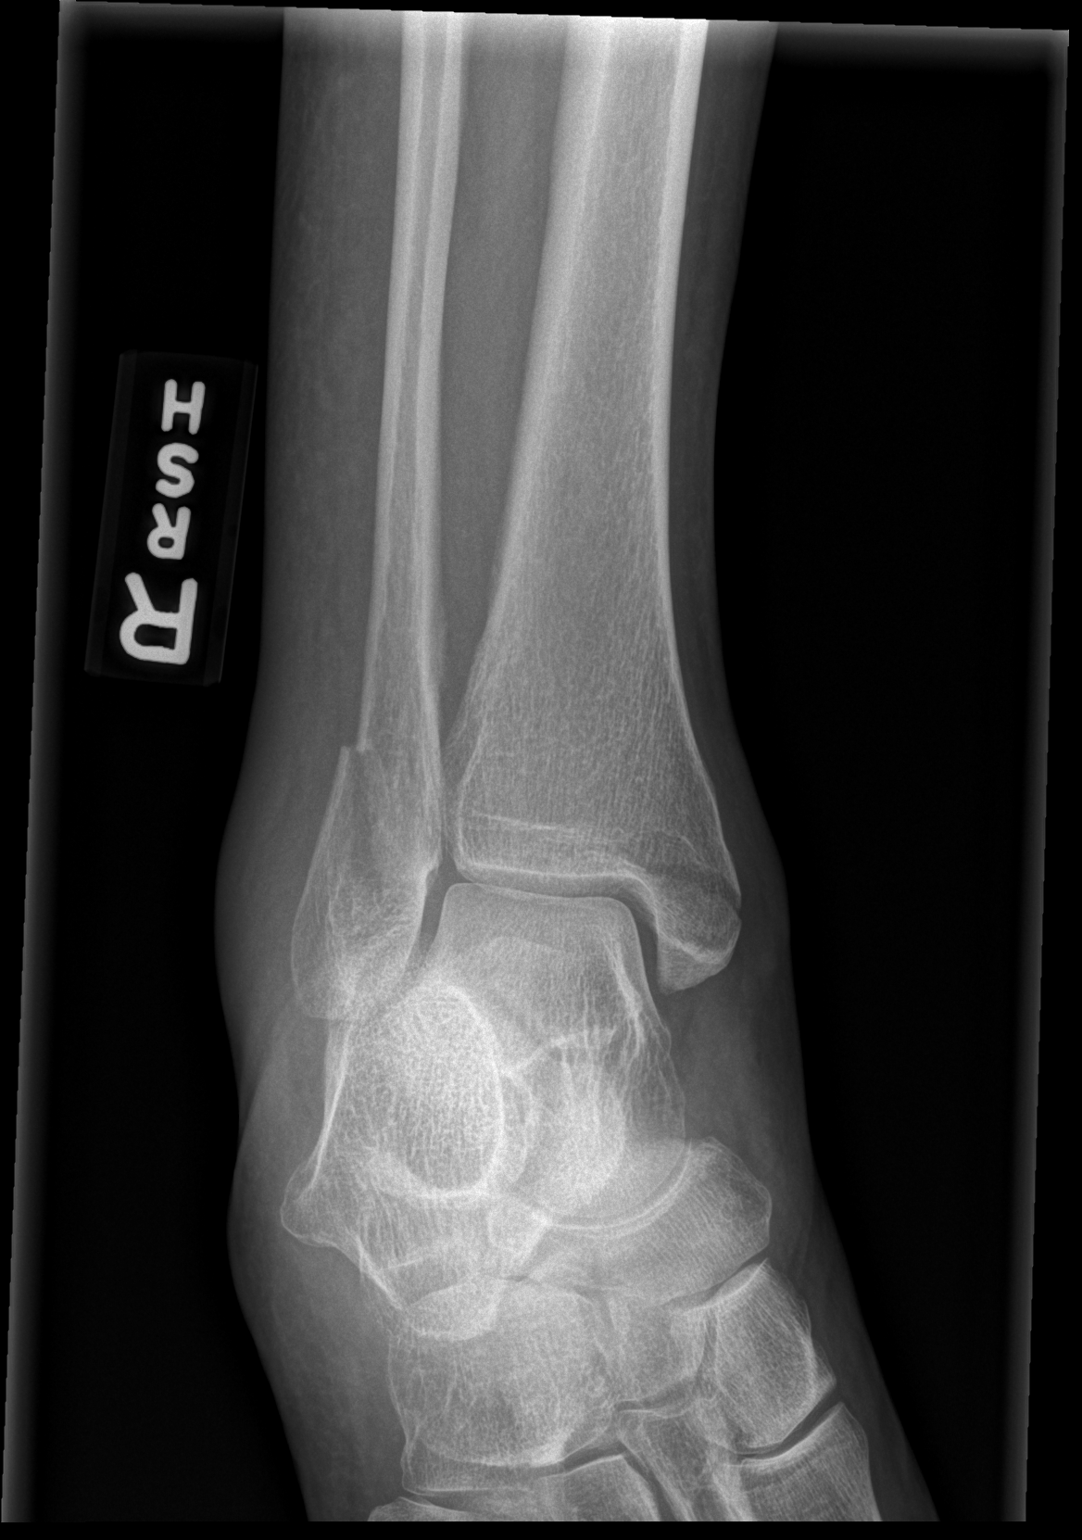

[x ankle lat right]
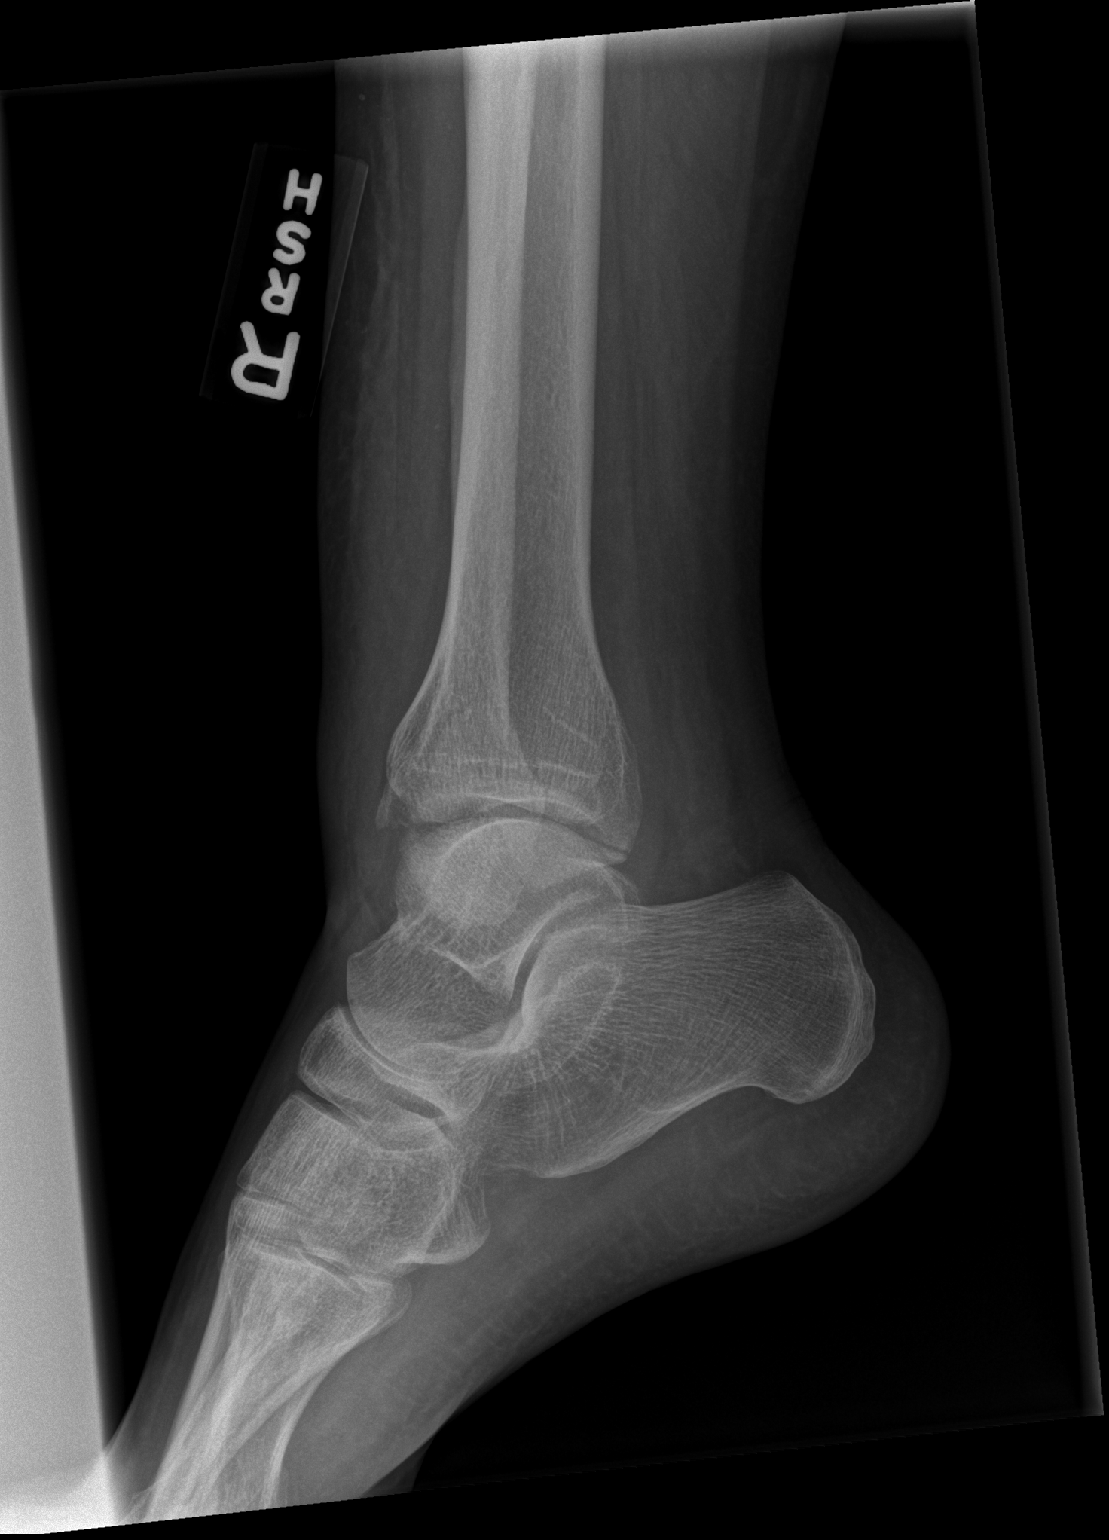

[3 of 3 positions shown; findings below may reference images not displayed]

FINDINGS: The ankle mortise is maintained.

Bimalleolar ankle fractures are noted with a transverse fracture
through the base of the medial malleolus without significant
displacement. There is also an oblique coursing distal fibular shaft
fracture at and above the level of the ankle mortise. No significant
displacement. The posterior malleolus is intact. The mid and
hindfoot bony structures are intact.
IMPRESSION: Relatively nondisplaced bimalleolar ankle fractures.

## 2020-10-26 IMAGING — DX RIGHT KNEE - COMPLETE 4+ VIEW
4 series · 4 of 4 positions shown · non-contrast
Comparison: None.

CLINICAL DATA: Fell.  Right knee pain.

EXAM:
RIGHT KNEE - COMPLETE 4+ VIEW

[x knee obl right (1 of 2)]
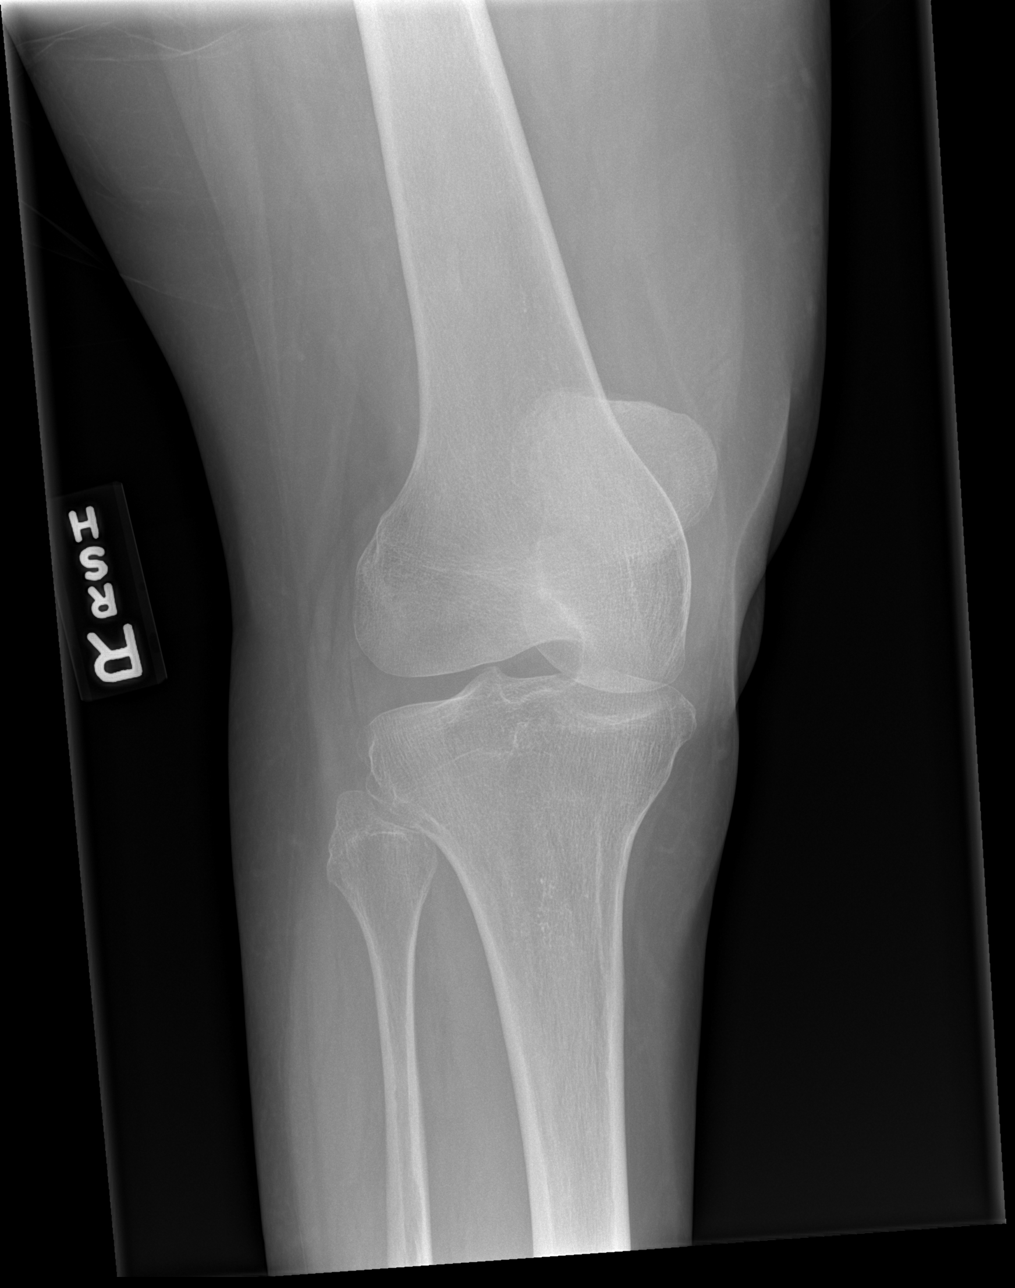

[x knee ap right]
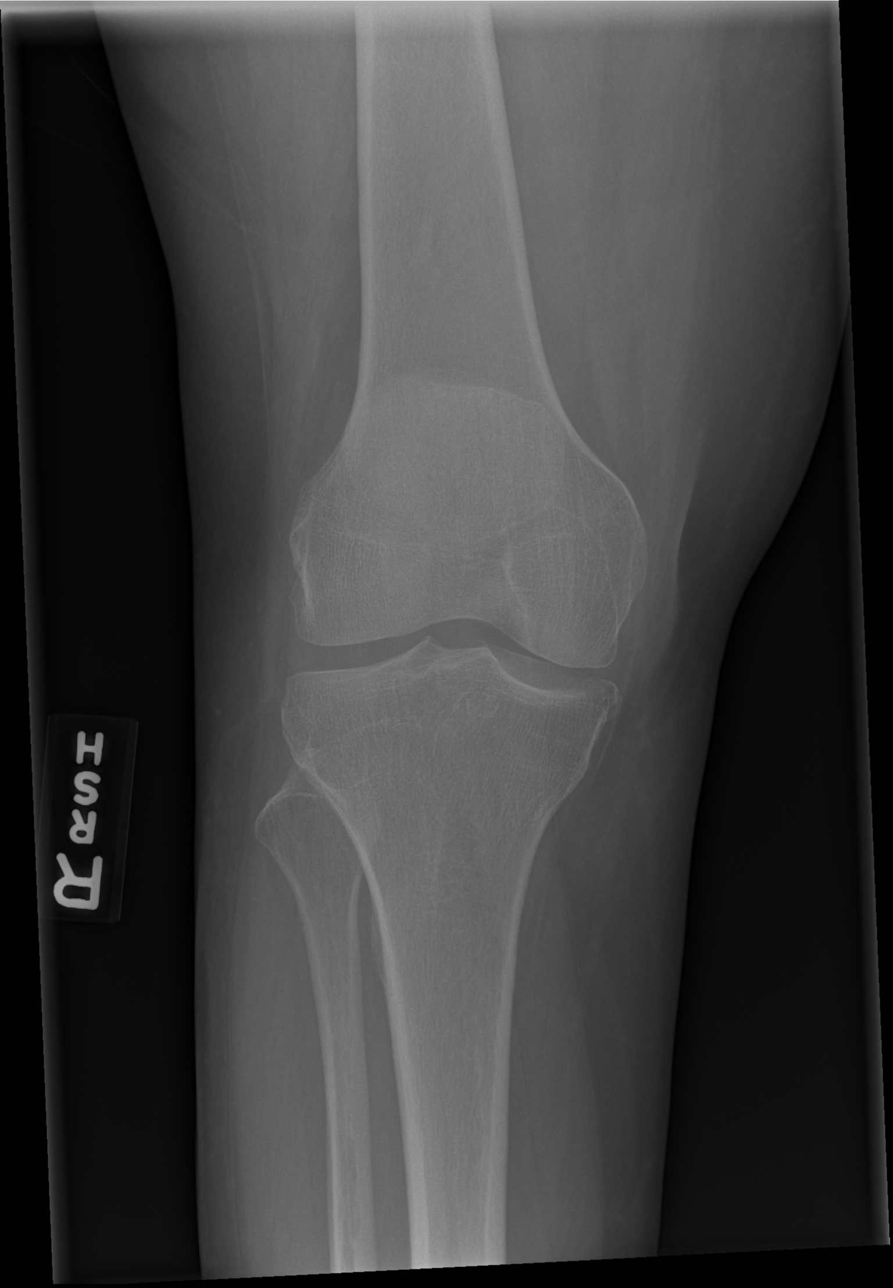

[x knee obl right (2 of 2)]
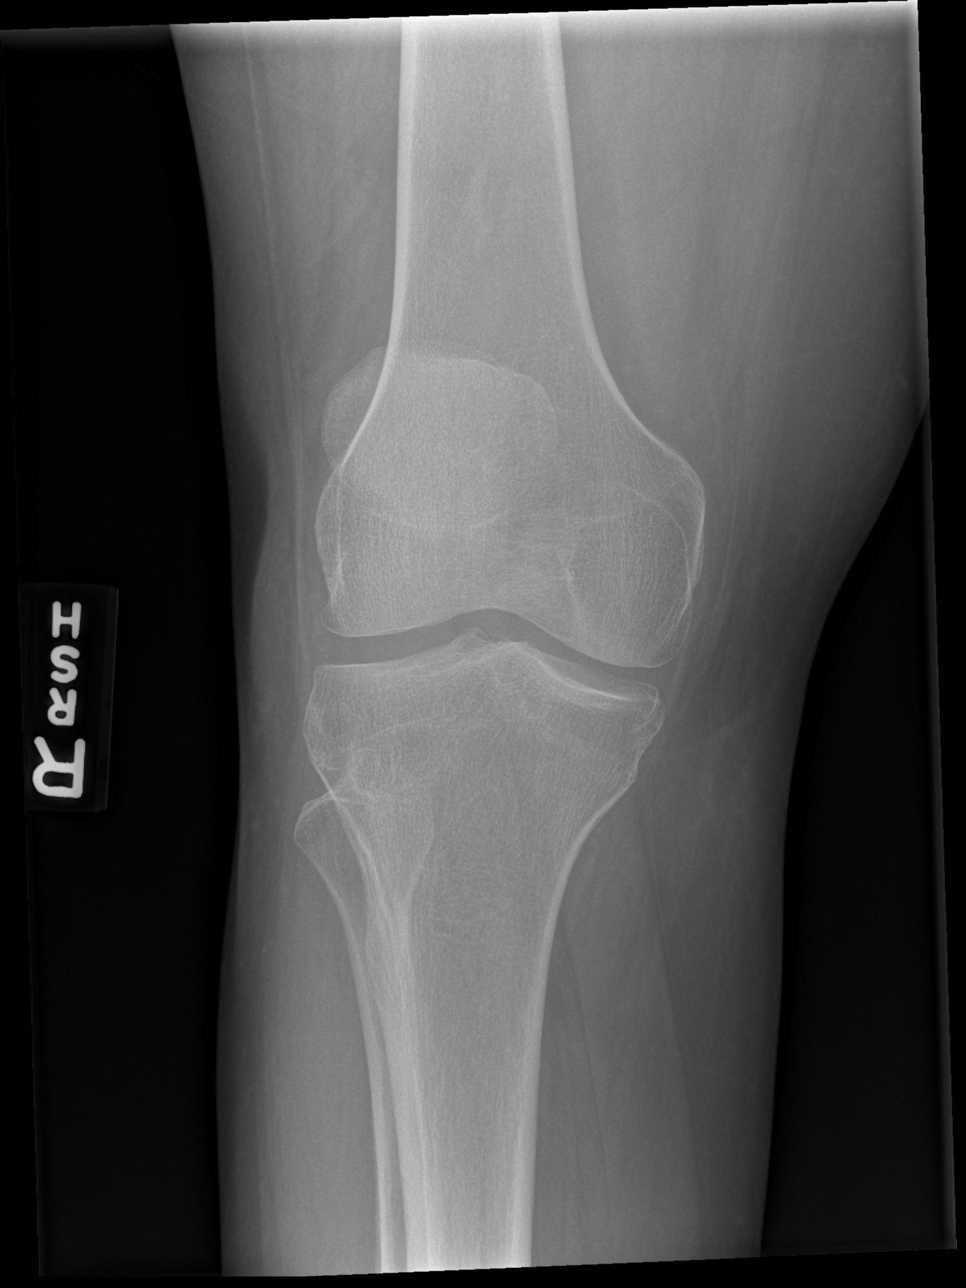

[x knee lat right]
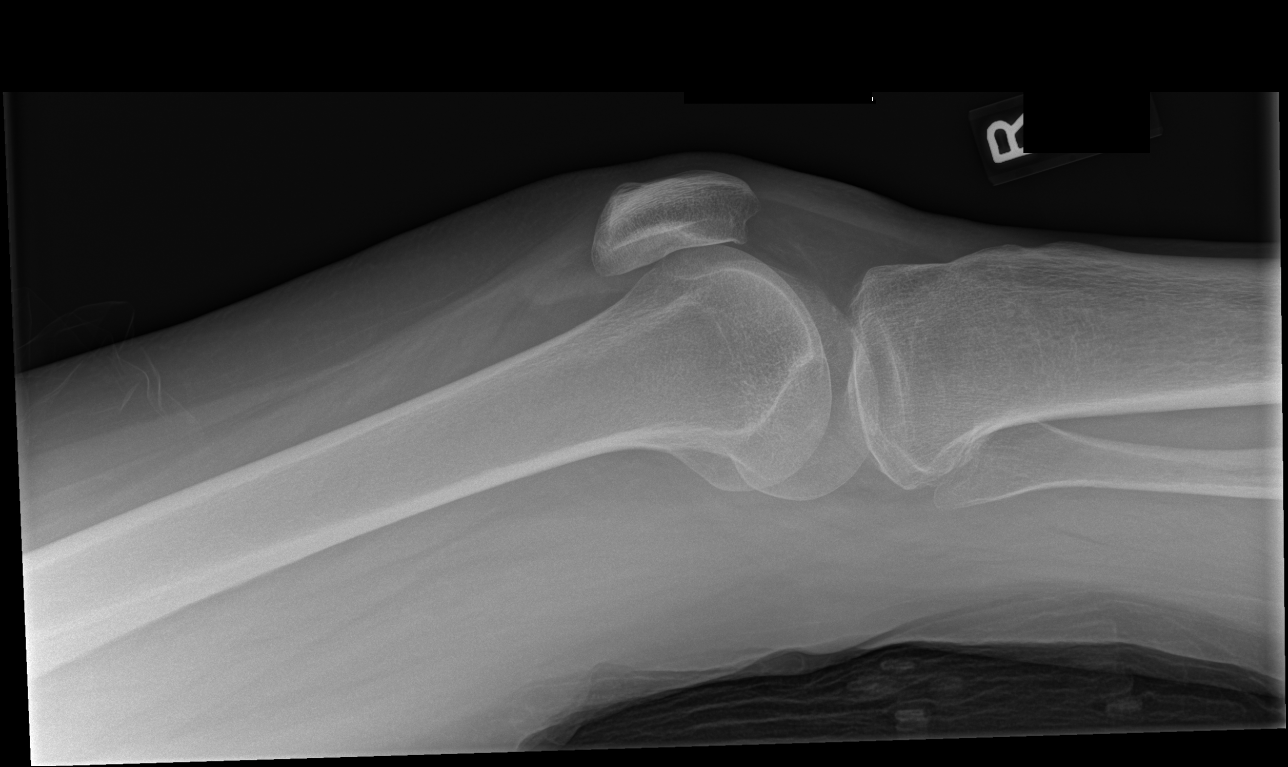

[4 of 4 positions shown; findings below may reference images not displayed]

FINDINGS: The joint spaces are maintained. No acute fracture is identified.
Small suprapatellar knee joint effusion.
IMPRESSION: No acute bony findings.

Small joint effusion.

## 2020-11-12 ENCOUNTER — Other Ambulatory Visit: Payer: Self-pay | Admitting: Physician Assistant

## 2020-11-12 ENCOUNTER — Ambulatory Visit
Admission: RE | Admit: 2020-11-12 | Discharge: 2020-11-12 | Disposition: A | Discharge status: Home / Self Care | Payer: BC Managed Care – PPO | Source: Ambulatory Visit | Attending: Physician Assistant | Admitting: Physician Assistant

## 2020-11-12 DIAGNOSIS — R059 Cough, unspecified: Secondary | ICD-10-CM

## 2021-11-03 NOTE — Telephone Encounter (Signed)
ERROR

## 2021-11-04 ENCOUNTER — Encounter: Attending: Family Medicine
